# Patient Record
Sex: Female | Born: 2000 | Race: Black or African American | Hispanic: No | Marital: Single | Smoking: Never smoker
Health system: Southern US, Community
[De-identification: ages and names within clinical notes are randomized; demographics above are authoritative.]

## PROBLEM LIST (undated history)

## (undated) DIAGNOSIS — R519 Headache, unspecified: Secondary | ICD-10-CM

## (undated) DIAGNOSIS — Z789 Other specified health status: Secondary | ICD-10-CM

## (undated) HISTORY — PX: NO PAST SURGERIES: SHX2092

## (undated) HISTORY — DX: Other specified health status: Z78.9

---
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
## 2019-03-25 ENCOUNTER — Emergency Department (INDEPENDENT_AMBULATORY_CARE_PROVIDER_SITE_OTHER)
Admission: EM | Admit: 2019-03-25 | Discharge: 2019-03-25 | Disposition: A | Discharge status: Home / Self Care | Payer: Self-pay | Source: Home / Self Care

## 2019-03-25 ENCOUNTER — Encounter: Payer: Self-pay | Admitting: Family Medicine

## 2019-03-25 ENCOUNTER — Other Ambulatory Visit: Payer: Self-pay

## 2019-03-25 DIAGNOSIS — Z20822 Contact with and (suspected) exposure to covid-19: Secondary | ICD-10-CM

## 2019-03-25 NOTE — ED Triage Notes (Signed)
Pt c/o HA, nasal congestion and fatigue x 1 day. Dad exposed to COVID.
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
## 2019-03-25 NOTE — Discharge Instructions (Addendum)
Vitamin D3 5000 IU (125 mg) daily °Vitamin C 500 mg twice daily °Zinc 50 to 75 mg daily ° °Listerine type mouthwash 4 times a day ° °

## 2019-03-27 LAB — NOVEL CORONAVIRUS, NAA: SARS-CoV-2, NAA: NOT DETECTED

## 2021-02-22 ENCOUNTER — Other Ambulatory Visit: Payer: Self-pay

## 2021-02-22 ENCOUNTER — Ambulatory Visit
Admission: EM | Admit: 2021-02-22 | Discharge: 2021-02-22 | Disposition: A | Discharge status: Home / Self Care | Payer: Managed Care, Other (non HMO) | Attending: Emergency Medicine | Admitting: Emergency Medicine

## 2021-02-22 DIAGNOSIS — M545 Low back pain, unspecified: Secondary | ICD-10-CM

## 2021-02-22 LAB — POCT URINE PREGNANCY: Preg Test, Ur: NEGATIVE

## 2021-02-22 MED ORDER — BACLOFEN 10 MG PO TABS: 10.0000 mg | ORAL_TABLET | Freq: Three times a day (TID) | ORAL | 0 refills | Status: AC

## 2021-02-22 MED ORDER — IBUPROFEN 800 MG PO TABS: 800.0000 mg | ORAL_TABLET | Freq: Three times a day (TID) | ORAL | 0 refills | Status: DC

## 2021-02-22 MED ORDER — KETOROLAC TROMETHAMINE 60 MG/2ML IM SOLN
60.0000 mg | Freq: Once | INTRAMUSCULAR | Status: AC
  Administered 2021-02-22: 60 mg via INTRAMUSCULAR

## 2021-02-22 NOTE — ED Triage Notes (Signed)
Pt presents with c/o neck and back pain. Pt states she wants a pregnancy test.

## 2021-02-22 NOTE — Discharge Instructions (Addendum)
You received an injection of ketorolac today, high-dose nonsteroidal anti-inflammatory pain medication that should significantly reduce your pain for the next 6 to 8 hours.  Please begin taking baclofen and ibuprofen tonight, prescriptions have been sent to your pharmacy.  I have provided you with a note to be out of work for the next few days, please take advantage of this time to drink plenty of fluid, get rest and perform gentle stretching as tolerated without pain.
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
## 2021-03-26 ENCOUNTER — Ambulatory Visit
Admission: EM | Admit: 2021-03-26 | Discharge: 2021-03-26 | Disposition: A | Discharge status: Other Acute Inpatient Hospital | Payer: Self-pay

## 2021-07-24 ENCOUNTER — Ambulatory Visit
Admission: EM | Admit: 2021-07-24 | Discharge: 2021-07-24 | Disposition: A | Discharge status: Home / Self Care | Payer: BC Managed Care – PPO | Attending: Internal Medicine | Admitting: Internal Medicine

## 2021-07-24 ENCOUNTER — Encounter: Payer: Self-pay | Admitting: Emergency Medicine

## 2021-07-24 DIAGNOSIS — A084 Viral intestinal infection, unspecified: Secondary | ICD-10-CM | POA: Diagnosis not present

## 2021-07-24 DIAGNOSIS — R197 Diarrhea, unspecified: Secondary | ICD-10-CM | POA: Diagnosis not present

## 2021-07-24 DIAGNOSIS — R112 Nausea with vomiting, unspecified: Secondary | ICD-10-CM

## 2021-07-24 LAB — POCT URINE PREGNANCY: Preg Test, Ur: NEGATIVE

## 2021-07-24 MED ORDER — ONDANSETRON 4 MG PO TBDP: 4.0000 mg | ORAL_TABLET | Freq: Three times a day (TID) | ORAL | 0 refills | Status: DC | PRN

## 2021-07-24 NOTE — ED Triage Notes (Signed)
Patient c/o vomiting, diarrhea and generalized abdominal pain last night after eating at [employer].  Patient states she's had migraines all week. ?

## 2021-07-24 NOTE — ED Provider Notes (Addendum)
?[facility] ? ? ? ?CSN: [id number] ?Arrival date & time: 07/24/21  1311 ? ? ?  ? ?History   ?Chief Complaint ?Chief Complaint  ?Patient presents with  ? Emesis  ? ? ?HPI ?Natalie Ortega is a [age] y.o. female.  ? ?Patient presents with nausea, vomiting, diarrhea, generalized stomach ache, headache that started last night.  Patient reports associated upper respiratory symptoms as well but states that they been present "since the season changed" due to allergies.  Patient is been having intermittent headaches all week as well.  Denies any associated fever.  Stomachache is generalized.  Denies blood in stool or emesis.  Patient reports gastrointestinal symptoms started after eating at [employer] last night but she denies that it is attributed to this as her friends and family ate the same food and do not have any symptoms.  Patient has not been able to keep any food or fluids down today.  Patient is currently on her menstrual cycle but reports that her menstrual cycles have been irregular over the past few months.  She may skip a menstrual cycle which is typically not normal for her.  She does not take birth control but has had unprotected sexual intercourse. ? ? ?Emesis ? ?History reviewed. No pertinent past medical history. ? ?Patient Active Problem List  ? Diagnosis Date Noted  ? Left knee injury 07/25/2016  ? ? ?History reviewed. No pertinent surgical history. ? ?OB History   ?No obstetric history on file. ?  ? ? ? ?Home Medications   ? ?Prior to Admission medications   ?Medication Sig Start Date End Date Taking? Authorizing Provider  ?ondansetron (ZOFRAN-ODT) 4 MG disintegrating tablet Take 1 tablet (4 mg total) by mouth every 8 (eight) hours as needed for nausea or vomiting. 07/24/21  Yes Lesha Jager, Rolly SalterHaley E, FNP  ?ibuprofen (ADVIL) 800 MG tablet Take 1 tablet (800 mg total) by mouth 3 (three) times daily. 02/22/21   Theadora RamaMorgan, Lindsay Scales, PA-C  ? ? ?Family History ?Family History  ?Problem Relation Age of Onset   ? Asthma Mother   ? ? ?Social History ?Social History  ? ?Tobacco Use  ? Smoking status: Never  ? Smokeless tobacco: Never  ?Vaping Use  ? Vaping Use: Every day  ? Substances: THC  ?Substance Use Topics  ? Alcohol use: No  ? Drug use: Yes  ?  Types: Marijuana  ? ? ? ?Allergies   ?Patient has no known allergies. ? ? ?Review of Systems ?Review of Systems ?Per HPI ? ?Physical Exam ?Triage Vital Signs ?ED Triage Vitals  ?Enc Vitals Group  ?   BP 07/24/21 1406 127/86  ?   Pulse Rate 07/24/21 1406 86  ?   Resp 07/24/21 1406 18  ?   Temp 07/24/21 1406 (!) 97.4 ?F (36.3 ?C)  ?   Temp Source 07/24/21 1406 Oral  ?   SpO2 07/24/21 1406 98 %  ?   Weight 07/24/21 1407 180 lb (81.6 kg)  ?   Height 07/24/21 1407 5\' 5"  (1.651 m)  ?   Head Circumference --   ?   Peak Flow --   ?   Pain Score 07/24/21 1407 9  ?   Pain Loc --   ?   Pain Edu? --   ?   Excl. in GC? --   ? ?No data found. ? ?Updated Vital Signs ?BP 127/86 (BP Location: Left Arm)   Pulse 86   Temp (!) 97.4 ?F (36.3 ?C) (Oral)  Resp 18   Ht 5\' 5"  (1.651 m)   Wt 180 lb (81.6 kg)   LMP 07/23/2021   SpO2 98%   BMI 29.95 kg/m?  ? ?Visual Acuity ?Right Eye Distance:   ?Left Eye Distance:   ?Bilateral Distance:   ? ?Right Eye Near:   ?Left Eye Near:    ?Bilateral Near:    ? ?Physical Exam ?Constitutional:   ?   General: She is not in acute distress. ?   Appearance: Normal appearance. She is not toxic-appearing or diaphoretic.  ?HENT:  ?   Head: Normocephalic and atraumatic.  ?   Mouth/Throat:  ?   Mouth: Mucous membranes are moist.  ?   Pharynx: No posterior oropharyngeal erythema.  ?Eyes:  ?   Extraocular Movements: Extraocular movements intact.  ?   Conjunctiva/sclera: Conjunctivae normal.  ?Cardiovascular:  ?   Rate and Rhythm: Normal rate and regular rhythm.  ?   Pulses: Normal pulses.  ?   Heart sounds: Normal heart sounds.  ?Pulmonary:  ?   Effort: Pulmonary effort is normal. No respiratory distress.  ?   Breath sounds: Normal breath sounds.  ?Abdominal:  ?    General: Abdomen is flat. Bowel sounds are normal. There is no distension.  ?   Palpations: Abdomen is soft.  ?   Tenderness: There is no abdominal tenderness.  ?Skin: ?   General: Skin is warm and dry.  ?Neurological:  ?   General: No focal deficit present.  ?   Mental Status: She is alert and oriented to person, place, and time. Mental status is at baseline.  ?   Cranial Nerves: Cranial nerves 2-12 are intact.  ?   Sensory: Sensation is intact.  ?   Motor: Motor function is intact.  ?   Coordination: Coordination is intact.  ?   Gait: Gait is intact.  ?Psychiatric:     ?   Mood and Affect: Mood normal.     ?   Behavior: Behavior normal.     ?   Thought Content: Thought content normal.     ?   Judgment: Judgment normal.  ? ? ? ?UC Treatments / Results  ?Labs ?(all labs ordered are listed, but only abnormal results are displayed) ?Labs Reviewed  ?POCT URINE PREGNANCY  ? ? ?EKG ? ? ?Radiology ?No results found. ? ?Procedures ?Procedures (including critical care time) ? ?Medications Ordered in UC ?Medications - No data to display ? ?Initial Impression / Assessment and Plan / UC Course  ?I have reviewed the triage vital signs and the nursing notes. ? ?Pertinent labs & imaging results that were available during my care of the patient were reviewed by me and considered in my medical decision making (see chart for details). ? ?  ? ?Patient's symptoms appear viral in etiology.  Suspect headaches are related to a virus.  No concern for dehydration at this time.  Patient advised to ensure adequate fluid hydration.  Ondansetron prescribed to take as needed for nausea.  Urine pregnancy was negative.  Patient advised to follow-up with gynecology for further evaluation and management of irregular menstrual cycles.  Neuro exam is normal so do not think that head imaging is necessary.  Discussed strict return and ER precautions.  Patient verbalized understanding and was agreeable with plan. ?Final Clinical Impressions(s) / UC  Diagnoses  ? ?Final diagnoses:  ?Viral gastroenteritis  ?Nausea vomiting and diarrhea  ? ? ? ?Discharge Instructions   ? ?  ?It appears that  you may have a viral stomach virus that should run its course and self resolve in the next few days.  You have been prescribed a nausea medication to take as needed.  Please ensure adequate fluid hydration.  Go to the hospital if symptoms persist or worsen. ? ? ? ?ED Prescriptions   ? ? Medication Sig Dispense Auth. Provider  ? ondansetron (ZOFRAN-ODT) 4 MG disintegrating tablet Take 1 tablet (4 mg total) by mouth every 8 (eight) hours as needed for nausea or vomiting. 20 tablet Ervin Knack E, [place]  ? ?  ? ?PDMP not reviewed this encounter. ?  ?Gustavus Bryant, [place] ?07/24/21 1436 ? ?  ?Gustavus Bryant, [place] ?07/24/21 1436 ? ?

## 2021-07-24 NOTE — Discharge Instructions (Signed)
It appears that you may have a viral stomach virus that should run its course and self resolve in the next few days.  You have been prescribed a nausea medication to take as needed.  Please ensure adequate fluid hydration.  Go to the hospital if symptoms persist or worsen. ?

## 2021-11-21 ENCOUNTER — Encounter (HOSPITAL_COMMUNITY): Payer: Self-pay | Admitting: Obstetrics & Gynecology

## 2021-11-21 ENCOUNTER — Inpatient Hospital Stay (HOSPITAL_COMMUNITY): Payer: BC Managed Care – PPO

## 2021-11-21 ENCOUNTER — Other Ambulatory Visit: Payer: Self-pay

## 2021-11-21 ENCOUNTER — Inpatient Hospital Stay (HOSPITAL_COMMUNITY)
Admission: EM | Admit: 2021-11-21 | Discharge: 2021-11-21 | Disposition: A | Discharge status: Home / Self Care | Payer: BC Managed Care – PPO | Attending: Obstetrics & Gynecology | Admitting: Obstetrics & Gynecology

## 2021-11-21 DIAGNOSIS — R1030 Lower abdominal pain, unspecified: Secondary | ICD-10-CM | POA: Insufficient documentation

## 2021-11-21 DIAGNOSIS — O26891 Other specified pregnancy related conditions, first trimester: Secondary | ICD-10-CM | POA: Diagnosis not present

## 2021-11-21 DIAGNOSIS — O26893 Other specified pregnancy related conditions, third trimester: Secondary | ICD-10-CM

## 2021-11-21 DIAGNOSIS — R110 Nausea: Secondary | ICD-10-CM | POA: Insufficient documentation

## 2021-11-21 DIAGNOSIS — Z3A01 Less than 8 weeks gestation of pregnancy: Secondary | ICD-10-CM | POA: Insufficient documentation

## 2021-11-21 LAB — CBC WITH DIFFERENTIAL/PLATELET
Abs Immature Granulocytes: 0.01 10*3/uL (ref 0.00–0.07)
Basophils Absolute: 0 10*3/uL (ref 0.0–0.1)
Basophils Relative: 1 %
Eosinophils Absolute: 0 10*3/uL (ref 0.0–0.5)
Eosinophils Relative: 1 %
HCT: 36.6 % (ref 36.0–46.0)
Hemoglobin: 13.6 g/dL (ref 12.0–15.0)
Immature Granulocytes: 0 %
Lymphocytes Relative: 48 %
Lymphs Abs: 2.7 10*3/uL (ref 0.7–4.0)
MCH: 31.3 pg (ref 26.0–34.0)
MCHC: 37.2 g/dL — ABNORMAL HIGH (ref 30.0–36.0)
MCV: 84.1 fL (ref 80.0–100.0)
Monocytes Absolute: 0.5 10*3/uL (ref 0.1–1.0)
Monocytes Relative: 9 %
Neutro Abs: 2.3 10*3/uL (ref 1.7–7.7)
Neutrophils Relative %: 41 %
Platelets: 149 10*3/uL — ABNORMAL LOW (ref 150–400)
RBC: 4.35 MIL/uL (ref 3.87–5.11)
RDW: 10.9 % — ABNORMAL LOW (ref 11.5–15.5)
WBC: 5.5 10*3/uL (ref 4.0–10.5)
nRBC: 0 % (ref 0.0–0.2)

## 2021-11-21 LAB — URINALYSIS, ROUTINE W REFLEX MICROSCOPIC
Bilirubin Urine: NEGATIVE
Glucose, UA: NEGATIVE mg/dL
Hgb urine dipstick: NEGATIVE
Ketones, ur: NEGATIVE mg/dL
Leukocytes,Ua: NEGATIVE
Nitrite: NEGATIVE
Protein, ur: NEGATIVE mg/dL
Specific Gravity, Urine: 1.014 (ref 1.005–1.030)
pH: 8 (ref 5.0–8.0)

## 2021-11-21 LAB — COMPREHENSIVE METABOLIC PANEL
ALT: 11 U/L (ref 0–44)
AST: 14 U/L — ABNORMAL LOW (ref 15–41)
Albumin: 4.3 g/dL (ref 3.5–5.0)
Alkaline Phosphatase: 40 U/L (ref 38–126)
Anion gap: 10 (ref 5–15)
BUN: 6 mg/dL (ref 6–20)
CO2: 21 mmol/L — ABNORMAL LOW (ref 22–32)
Calcium: 9.4 mg/dL (ref 8.9–10.3)
Chloride: 107 mmol/L (ref 98–111)
Creatinine, Ser: 0.71 mg/dL (ref 0.44–1.00)
GFR, Estimated: >60 mL/min (ref >60)
Glucose, Bld: 89 mg/dL (ref 70–99)
Potassium: 3.5 mmol/L (ref 3.5–5.1)
Sodium: 138 mmol/L (ref 135–145)
Total Bilirubin: 0.9 mg/dL (ref 0.3–1.2)
Total Protein: 7.2 g/dL (ref 6.5–8.1)

## 2021-11-21 LAB — HIV ANTIBODY (ROUTINE TESTING W REFLEX): HIV Screen 4th Generation wRfx: NONREACTIVE

## 2021-11-21 LAB — WET PREP, GENITAL
Clue Cells Wet Prep HPF POC: NONE SEEN
Sperm: NONE SEEN
Trich, Wet Prep: NONE SEEN
WBC, Wet Prep HPF POC: <10 (ref <10)
Yeast Wet Prep HPF POC: NONE SEEN

## 2021-11-21 LAB — HCG, QUANTITATIVE, PREGNANCY: hCG, Beta Chain, Quant, S: 14168 m[IU]/mL — ABNORMAL HIGH (ref <5)

## 2021-11-21 LAB — ABO/RH: ABO/RH(D): O POS

## 2021-11-21 LAB — POCT PREGNANCY, URINE: Preg Test, Ur: POSITIVE — AB

## 2021-11-21 MED ORDER — ONDANSETRON 4 MG PO TBDP: 4.0000 mg | ORAL_TABLET | Freq: Three times a day (TID) | ORAL | 0 refills | Status: DC | PRN

## 2021-11-21 NOTE — MAU Provider Note (Signed)
History     CSN: [id number]  Arrival date and time: 11/21/21 1148   None     Chief Complaint  Patient presents with   Abdominal Pain   HPI  Natalie Ortega is a 21 y.o. female G1P0 @ [redacted]w[redacted]d here in MAU with lower abdominal pain. The pain has been present for a few weeks. She has no bleeding. She is certain of her LMP. The pain comes and goes. The pain worsens when she is sitting and relaxing. She has not taken anything for the pain.   She has an appointment scheduled with [facility] this month.  Occasional Nausea.    OB History     Gravida  1   Para      Term      Preterm      AB      Living         SAB      IAB      Ectopic      Multiple      Live Births              History reviewed. No pertinent past medical history.  History reviewed. No pertinent surgical history.  Family History  Problem Relation Age of Onset   Asthma Mother     Social History   Tobacco Use   Smoking status: Never   Smokeless tobacco: Never  Vaping Use   Vaping Use: Every day   Substances: THC  Substance Use Topics   Alcohol use: No    Comment: 1 glass of wine within the last  wks   Drug use: Not Currently    Types: Marijuana    Allergies: No Known Allergies  Medications Prior to Admission  Medication Sig Dispense Refill Last Dose   ibuprofen (ADVIL) 800 MG tablet Take 1 tablet (800 mg total) by mouth 3 (three) times daily. 21 tablet 0    ondansetron (ZOFRAN-ODT) 4 MG disintegrating tablet Take 1 tablet (4 mg total) by mouth every 8 (eight) hours as needed for nausea or vomiting. 20 tablet 0    Results for orders placed or performed during the hospital encounter of 11/21/21 (from the past 48 hour(s))  Pregnancy, urine POC     Status: Abnormal   Collection Time: 11/21/21 12:16 PM  Result Value Ref Range   Preg Test, Ur POSITIVE (A) NEGATIVE    Comment:        THE SENSITIVITY OF THIS METHODOLOGY IS >24 mIU/mL   CBC with Differential/Platelet     Status: Abnormal    Collection Time: 11/21/21 12:55 PM  Result Value Ref Range   WBC 5.5 4.0 - 10.5 K/uL    Comment: WHITE COUNT CONFIRMED ON SMEAR   RBC 4.35 3.87 - 5.11 MIL/uL   Hemoglobin 13.6 12.0 - 15.0 g/dL   HCT 48.1 85.6 - 31.4 %   MCV 84.1 80.0 - 100.0 fL   MCH 31.3 26.0 - 34.0 pg   MCHC 37.2 (H) 30.0 - 36.0 g/dL   RDW 97.0 (L) 26.3 - 78.5 %   Platelets 149 (L) 150 - 400 K/uL   nRBC 0.0 0.0 - 0.2 %   Neutrophils Relative % 41 %   Neutro Abs 2.3 1.7 - 7.7 K/uL   Lymphocytes Relative 48 %   Lymphs Abs 2.7 0.7 - 4.0 K/uL   Monocytes Relative 9 %   Monocytes Absolute 0.5 0.1 - 1.0 K/uL   Eosinophils Relative 1 %   Eosinophils Absolute 0.0 0.0 -  0.5 K/uL   Basophils Relative 1 %   Basophils Absolute 0.0 0.0 - 0.1 K/uL   WBC Morphology MORPHOLOGY UNREMARKABLE    RBC Morphology MORPHOLOGY UNREMARKABLE    Smear Review MORPHOLOGY UNREMARKABLE    Immature Granulocytes 0 %   Abs Immature Granulocytes 0.01 0.00 - 0.07 K/uL    Comment: Performed at [facility], [address] [place], [place] [address]  Comprehensive metabolic panel     Status: Abnormal   Collection Time: 11/21/21 12:55 PM  Result Value Ref Range   Sodium 138 135 - 145 mmol/L   Potassium 3.5 3.5 - 5.1 mmol/L   Chloride 107 98 - 111 mmol/L   CO2 21 (L) 22 - 32 mmol/L   Glucose, Bld 89 70 - 99 mg/dL    Comment: Glucose reference range applies only to samples taken after fasting for at least 8 hours.   BUN 6 6 - 20 mg/dL   Creatinine, Ser 8.41 0.44 - 1.00 mg/dL   Calcium 9.4 8.9 - 32.4 mg/dL   Total Protein 7.2 6.5 - 8.1 g/dL   Albumin 4.3 3.5 - 5.0 g/dL   AST 14 (L) 15 - 41 U/L   ALT 11 0 - 44 U/L   Alkaline Phosphatase 40 38 - 126 U/L   Total Bilirubin 0.9 0.3 - 1.2 mg/dL   GFR, Estimated >40 >10 mL/min    Comment: (NOTE) Calculated using the CKD-EPI Creatinine Equation (2021)    Anion gap 10 5 - 15    Comment: Performed at [facility], [address] [place], [place] [address]  ABO/Rh     Status: None    Collection Time: 11/21/21 12:55 PM  Result Value Ref Range   ABO/RH(D) O POS    No rh immune globuloin      NOT A RH IMMUNE GLOBULIN CANDIDATE, PT RH POSITIVE Performed at [facility], [address] [place], [place] [address]   Urinalysis, Routine w reflex microscopic Urine, Clean Catch     Status: None   Collection Time: 11/21/21  1:15 PM  Result Value Ref Range   Color, Urine YELLOW YELLOW   APPearance CLEAR CLEAR   Specific Gravity, Urine 1.014 1.005 - 1.030   pH 8.0 5.0 - 8.0   Glucose, UA NEGATIVE NEGATIVE mg/dL   Hgb urine dipstick NEGATIVE NEGATIVE   Bilirubin Urine NEGATIVE NEGATIVE   Ketones, ur NEGATIVE NEGATIVE mg/dL   Protein, ur NEGATIVE NEGATIVE mg/dL   Nitrite NEGATIVE NEGATIVE   Leukocytes,Ua NEGATIVE NEGATIVE    Comment: Performed at [facility], [address], [place], [place] [address]  Wet prep, genital     Status: None   Collection Time: 11/21/21  1:41 PM   Specimen: PATH Cytology Cervicovaginal Ancillary Only  Result Value Ref Range   Yeast Wet Prep HPF POC NONE SEEN NONE SEEN   Trich, Wet Prep NONE SEEN NONE SEEN   Clue Cells Wet Prep HPF POC NONE SEEN NONE SEEN   WBC, Wet Prep HPF POC <10 <10   Sperm NONE SEEN     Comment: Performed at [facility], [address] [place], [place] [address]    US OB LESS THAN 14 WEEKS WITH [place] TRANSVAGINAL  Result Date: 11/21/2021 CLINICAL DATA:  Cramping. EXAM: OBSTETRIC <14 WK [place] AND TRANSVAGINAL OB US TECHNIQUE: Both transabdominal and transvaginal ultrasound examinations were performed for complete evaluation of the gestation as well as the maternal uterus, adnexal regions,  and pelvic cul-de-sac. Transvaginal technique was performed to assess early pregnancy. COMPARISON:  None Available. FINDINGS: Intrauterine gestational sac: Single Yolk sac:  Visualized. Embryo:  Not Visualized. Cardiac Activity: Not Visualized. MSD: 11.8 mm   6 w   0 d Subchorionic hemorrhage:  None visualized. Maternal  uterus/adnexae: No free fluid is noted. Probable corpus luteum seen in left ovary. Right ovary is unremarkable. IMPRESSION: Probable early intrauterine gestational sac with yolk sac, but no fetal pole or cardiac activity yet visualized. Recommend follow-up quantitative B-HCG levels and follow-up US in 14 days to assess viability. This recommendation follows SRU consensus guidelines: Diagnostic Criteria for Nonviable Pregnancy Early in the First Trimester. Malva Limes Med 2013; 630:1601-09. Electronically Signed   By: Lupita Raider M.D.   On: 11/21/2021 13:38     Review of Systems  Gastrointestinal:  Positive for abdominal pain, nausea and vomiting.  Genitourinary:  Negative for vaginal bleeding.   Physical Exam   Blood pressure 111/78, pulse 73, temperature 98 F (36.7 C), temperature source Oral, resp. rate 15, weight 84.8 kg, last menstrual period 10/15/2021, SpO2 99 %.  Physical Exam Constitutional:      General: She is not in acute distress.    Appearance: She is well-developed. She is not ill-appearing, toxic-appearing or diaphoretic.  Abdominal:     Tenderness: There is generalized abdominal tenderness. There is no guarding or rebound.  Skin:    General: Skin is warm.  Neurological:     Mental Status: She is alert and oriented to person, place, and time.    Procedures  MDM  Wet prep & GC HIV, CBC, Hcg, ABO US OB transvaginal   Reviewed [place] results with patient and partner.   Assessment and Plan   A:  1. Abdominal pain during pregnancy in third trimester   2. [redacted] weeks gestation of pregnancy      P:  Dc home Return to MAU if symptoms worsen Keep OB appointment Start prenatal vitamins RX: Zofran as needed  Venia Carbon I, NP. 11/21/2021 2:21 PM

## 2021-11-21 NOTE — MAU Note (Signed)
Pt reports to mau with c/o lower abd cramping and stabbing pain that has been ongoing for the past 3 weeks.  Pt denies bleeding or dc

## 2021-11-23 LAB — GC/CHLAMYDIA PROBE AMP (~~LOC~~) NOT AT ARMC
Chlamydia: NEGATIVE
Comment: NEGATIVE
Comment: NORMAL
Neisseria Gonorrhea: NEGATIVE

## 2021-12-31 ENCOUNTER — Other Ambulatory Visit: Payer: Self-pay

## 2021-12-31 ENCOUNTER — Inpatient Hospital Stay (HOSPITAL_COMMUNITY)
Admission: AD | Admit: 2021-12-31 | Discharge: 2021-12-31 | Disposition: A | Discharge status: Home / Self Care | Payer: BC Managed Care – PPO | Attending: Obstetrics and Gynecology | Admitting: Obstetrics and Gynecology

## 2021-12-31 ENCOUNTER — Encounter (HOSPITAL_COMMUNITY): Payer: Self-pay | Admitting: Obstetrics and Gynecology

## 2021-12-31 DIAGNOSIS — R0602 Shortness of breath: Secondary | ICD-10-CM | POA: Insufficient documentation

## 2021-12-31 DIAGNOSIS — R200 Anesthesia of skin: Secondary | ICD-10-CM | POA: Insufficient documentation

## 2021-12-31 DIAGNOSIS — O26891 Other specified pregnancy related conditions, first trimester: Secondary | ICD-10-CM | POA: Diagnosis not present

## 2021-12-31 DIAGNOSIS — Z3A11 11 weeks gestation of pregnancy: Secondary | ICD-10-CM | POA: Insufficient documentation

## 2021-12-31 DIAGNOSIS — R202 Paresthesia of skin: Secondary | ICD-10-CM | POA: Insufficient documentation

## 2021-12-31 LAB — GLUCOSE, CAPILLARY: Glucose-Capillary: 92 mg/dL (ref 70–99)

## 2021-12-31 NOTE — ED Notes (Signed)
Cant find pt in lobby

## 2021-12-31 NOTE — MAU Note (Signed)
Natalie Ortega is a 21 y.o. at [redacted]w[redacted]d here in MAU reporting: last night started experiencing some arm numbness. It started at her wrist and shot up to the shoulder. Now is feeling numb from elbow to shoulder. This is affected her right arm only. Numbness is constant. No abdominal pain or bleeding.   Onset of complaint: last night  Pain score: 8/10  Vitals:   12/31/21 1222  BP: 128/76  Pulse: 83  Resp: 16  Temp: 98.1 F (36.7 C)  SpO2: 100%     FHT:178  Lab orders placed from triage: none

## 2021-12-31 NOTE — MAU Provider Note (Signed)
History     [id number]  Arrival date and time: 12/31/21 1202    Chief Complaint  Patient presents with   Numbness     HPI Natalie Ortega is a 21 y.o. at [redacted]w[redacted]d who presents for arm numbness & shortness of breath. Symptoms started last night. Reports bilateral arm numbness & tingling - mostly constant with occasional reprieve that only lasts a minute then returns if she moves. Also reports some intermittent numbness & tingling of her left leg. Since symptoms started she has also noticed shortness of breath. SOB is constant even while at rest but is worse when she's walking. Has history of migraines & reports headache a few days ago but currently denies headache. Reports some neck pain while sleeping last night. Denies fever, chest pain, cough, injury/accident/fall. Denies abdominal pain or vaginal bleeding. Has f/u with [facility] next week.   OB History     Gravida  1   Para      Term      Preterm      AB      Living         SAB      IAB      Ectopic      Multiple      Live Births              Past Medical History:  Diagnosis Date   No pertinent past medical history     Past Surgical History:  Procedure Laterality Date   NO PAST SURGERIES      Family History  Problem Relation Age of Onset   Asthma Mother     No Known Allergies  No current facility-administered medications on file prior to encounter.   Current Outpatient Medications on File Prior to Encounter  Medication Sig Dispense Refill   Prenatal Vit-Fe Fumarate-FA (M-NATAL PLUS) 27-1 MG TABS Take 1 tablet by mouth daily.     ondansetron (ZOFRAN-ODT) 4 MG disintegrating tablet Take 1 tablet (4 mg total) by mouth every 8 (eight) hours as needed for nausea or vomiting. 20 tablet 0     ROS Pertinent positives and negative per HPI, all others reviewed and negative  Physical Exam   BP 120/71   Pulse 85   Temp 99.3 F (37.4 C)   Resp 16   Ht 5\' 5"  (1.651 m)   Wt 82.5 kg   LMP 10/15/2021    SpO2 100%   BMI 30.25 kg/m   Patient Vitals for the past 24 hrs:  BP Temp Temp src Pulse Resp SpO2 Height Weight  12/31/21 1317 120/71 99.3 F (37.4 C) -- 85 16 100 % -- --  12/31/21 1222 128/76 98.1 F (36.7 C) Oral 83 16 100 % -- --  12/31/21 1215 -- -- -- -- -- -- 5\' 5"  (1.651 m) 82.5 kg    Physical Exam Vitals and nursing note reviewed.  Constitutional:      General: She is not in acute distress.    Appearance: Normal appearance. She is not diaphoretic.  HENT:     Head: Normocephalic and atraumatic.  Eyes:     General: No scleral icterus.    Conjunctiva/sclera: Conjunctivae normal.  Pulmonary:     Effort: Pulmonary effort is normal. No respiratory distress.  Neurological:     General: No focal deficit present.     Mental Status: She is alert.  Psychiatric:        Mood and Affect: Mood normal.  Behavior: Behavior normal.       Labs Results for orders placed or performed during the hospital encounter of 12/31/21 (from the past 24 hour(s))  Glucose, capillary     Status: None   Collection Time: 12/31/21  1:18 PM  Result Value Ref Range   Glucose-Capillary 92 70 - 99 mg/dL    Imaging No results found.  MAU Course  Procedures Lab Orders         Glucose, capillary    No orders of the defined types were placed in this encounter.  Imaging Orders  No imaging studies ordered today    MDM FHT present via doppler No OB complaints Vital signs stable  Assessment and Plan   1. Bilateral numbness and tingling of arms and legs   2. Shortness of breath   3. [redacted] weeks gestation of pregnancy    -Discussed patient with Delmer Islam PA in ED. Will transfer patient to [facility] for further evaluation of symptoms  Jorje Guild, NP 12/31/21 1:47 PM

## 2021-12-31 NOTE — MAU Note (Signed)
Report given to [facility] in [place]. Will transport pt via wheelchair.

## 2022-01-03 LAB — OB RESULTS CONSOLE RUBELLA ANTIBODY, IGM: Rubella: NON-IMMUNE/NOT IMMUNE

## 2022-01-03 LAB — OB RESULTS CONSOLE HEPATITIS B SURFACE ANTIGEN: Hepatitis B Surface Ag: NEGATIVE

## 2022-01-03 LAB — HEPATITIS C ANTIBODY: HCV Ab: NEGATIVE

## 2022-01-03 LAB — OB RESULTS CONSOLE ANTIBODY SCREEN: Antibody Screen: NEGATIVE

## 2022-03-18 ENCOUNTER — Inpatient Hospital Stay (HOSPITAL_COMMUNITY)
Admission: AD | Admit: 2022-03-18 | Discharge: 2022-03-18 | Disposition: A | Discharge status: Home / Self Care | Payer: BC Managed Care – PPO | Attending: Obstetrics and Gynecology | Admitting: Obstetrics and Gynecology

## 2022-03-18 ENCOUNTER — Encounter (HOSPITAL_COMMUNITY): Payer: Self-pay | Admitting: Obstetrics and Gynecology

## 2022-03-18 DIAGNOSIS — O26892 Other specified pregnancy related conditions, second trimester: Secondary | ICD-10-CM | POA: Insufficient documentation

## 2022-03-18 DIAGNOSIS — R519 Headache, unspecified: Secondary | ICD-10-CM | POA: Diagnosis not present

## 2022-03-18 DIAGNOSIS — Z3A22 22 weeks gestation of pregnancy: Secondary | ICD-10-CM | POA: Insufficient documentation

## 2022-03-18 DIAGNOSIS — O212 Late vomiting of pregnancy: Secondary | ICD-10-CM | POA: Insufficient documentation

## 2022-03-18 HISTORY — DX: Headache, unspecified: R51.9

## 2022-03-18 LAB — CBC
HCT: 30.8 % — ABNORMAL LOW (ref 36.0–46.0)
Hemoglobin: 10.8 g/dL — ABNORMAL LOW (ref 12.0–15.0)
MCH: 31.5 pg (ref 26.0–34.0)
MCHC: 35.1 g/dL (ref 30.0–36.0)
MCV: 89.8 fL (ref 80.0–100.0)
Platelets: 123 10*3/uL — ABNORMAL LOW (ref 150–400)
RBC: 3.43 MIL/uL — ABNORMAL LOW (ref 3.87–5.11)
RDW: 12.3 % (ref 11.5–15.5)
WBC: 7.3 10*3/uL (ref 4.0–10.5)
nRBC: 0 % (ref 0.0–0.2)

## 2022-03-18 LAB — URINALYSIS, ROUTINE W REFLEX MICROSCOPIC
Bilirubin Urine: NEGATIVE
Glucose, UA: NEGATIVE mg/dL
Hgb urine dipstick: NEGATIVE
Ketones, ur: NEGATIVE mg/dL
Leukocytes,Ua: NEGATIVE
Nitrite: NEGATIVE
Protein, ur: NEGATIVE mg/dL
Specific Gravity, Urine: 1.006 (ref 1.005–1.030)
pH: 7 (ref 5.0–8.0)

## 2022-03-18 LAB — COMPREHENSIVE METABOLIC PANEL
ALT: 18 U/L (ref 0–44)
AST: 24 U/L (ref 15–41)
Albumin: 3.4 g/dL — ABNORMAL LOW (ref 3.5–5.0)
Alkaline Phosphatase: 42 U/L (ref 38–126)
Anion gap: 11 (ref 5–15)
BUN: <5 mg/dL — ABNORMAL LOW (ref 6–20)
CO2: 19 mmol/L — ABNORMAL LOW (ref 22–32)
Calcium: 9.1 mg/dL (ref 8.9–10.3)
Chloride: 107 mmol/L (ref 98–111)
Creatinine, Ser: 0.56 mg/dL (ref 0.44–1.00)
GFR, Estimated: >60 mL/min (ref >60)
Glucose, Bld: 74 mg/dL (ref 70–99)
Potassium: 3.7 mmol/L (ref 3.5–5.1)
Sodium: 137 mmol/L (ref 135–145)
Total Bilirubin: 0.3 mg/dL (ref 0.3–1.2)
Total Protein: 6.5 g/dL (ref 6.5–8.1)

## 2022-03-18 LAB — PROTEIN / CREATININE RATIO, URINE
Creatinine, Urine: 43 mg/dL
Total Protein, Urine: <6 mg/dL

## 2022-03-18 MED ORDER — EXCEDRIN TENSION HEADACHE 500-65 MG PO TABS: 2.0000 | ORAL_TABLET | Freq: Four times a day (QID) | ORAL | 0 refills | Status: AC | PRN

## 2022-03-18 MED ORDER — DIPHENHYDRAMINE HCL 50 MG/ML IJ SOLN
25.0000 mg | Freq: Once | INTRAMUSCULAR | Status: AC
  Administered 2022-03-18: 25 mg via INTRAVENOUS
  Filled 2022-03-18: qty 1

## 2022-03-18 MED ORDER — CYCLOBENZAPRINE HCL 10 MG PO TABS: 10.0000 mg | ORAL_TABLET | Freq: Two times a day (BID) | ORAL | 0 refills | Status: AC | PRN

## 2022-03-18 MED ORDER — METOCLOPRAMIDE HCL 5 MG/ML IJ SOLN
10.0000 mg | Freq: Once | INTRAMUSCULAR | Status: AC
  Administered 2022-03-18: 10 mg via INTRAVENOUS
  Filled 2022-03-18: qty 2

## 2022-03-18 MED ORDER — LACTATED RINGERS IV BOLUS
1000.0000 mL | Freq: Once | INTRAVENOUS | Status: AC
  Administered 2022-03-18: 1000 mL via INTRAVENOUS

## 2022-03-18 NOTE — MAU Note (Incomplete)
.  Natalie Ortega is a 21 y.o. at [redacted]w[redacted]d here in MAU reporting:  For the past 4 days she has been seeing black spots in her vision, feels dizzy and get as a bad headache. Arms become weak and numb and it feels like she is going to pass out.  Has taken fiorecet with some releif but not completely goes away.   Onset of complaint: 4 days Pain score: 7 Vitals:   03/18/22 1112  BP: 125/71  Pulse: 81  Resp: 18  Temp: 98.2 F (36.8 C)     FHT:144 Lab orders placed from triage:  u/a

## 2022-03-18 NOTE — MAU Provider Note (Signed)
History     CSN: [id number]  Arrival date and time: 03/18/22 1050   None     Chief Complaint  Patient presents with   Headache   Dizziness   HPI Natalie Ortega is a 21 y.o. G1P0 at [redacted]w[redacted]d who presents to MAU for headache. Patient reports a constant frontal/behind the eyes headache for the last 4 days. She reports numbness in her hands and then seeing spots in her vision which is promptly followed by a headache and nausea/vomiting. She has a history of migraines but has never had numbness or spots in vision with her migraines. Nothing makes the headache better. Bright lights aggravate symptoms. She has been taking Fioricet without any relief. She reports she did try Tylenol 2 nights ago which also did not help. She is currently rating headache 7/10. She has had approximately two 16oz bottles plus a large cup of water to drink in the last 24 hours. She reports she last ate yesterday- had [place], but has not had anything today because of the headache.   Patient receives [facility] at [facility] and next appointment is scheduled on 1/17.    OB History     Gravida  1   Para      Term      Preterm      AB      Living         SAB      IAB      Ectopic      Multiple      Live Births              Past Medical History:  Diagnosis Date   Headache    No pertinent past medical history     Past Surgical History:  Procedure Laterality Date   NO PAST SURGERIES      Family History  Problem Relation Age of Onset   Asthma Mother     Social History   Tobacco Use   Smoking status: Never   Smokeless tobacco: Never  Vaping Use   Vaping Use: Every day   Substances: THC  Substance Use Topics   Alcohol use: No    Comment: 1 glass of wine within the last  wks   Drug use: Not Currently    Types: Marijuana    Allergies: No Known Allergies  No medications prior to admission.   Review of Systems  Constitutional: Negative.   Eyes:  Positive for photophobia  and visual disturbance.  Respiratory: Negative.    Cardiovascular: Negative.   Gastrointestinal:  Positive for nausea and vomiting.  Genitourinary: Negative.   Neurological:  Positive for numbness (in hands) and headaches.   Physical Exam  Patient Vitals for the past 24 hrs:  BP Temp Pulse Resp SpO2 Height Weight  03/18/22 1513 119/69 -- 83 15 100 % -- --  03/18/22 1230 118/63 -- 82 -- 100 % -- --  03/18/22 1225 -- -- -- -- 100 % -- --  03/18/22 1220 -- -- -- -- 99 % -- --  03/18/22 1216 101/80 -- 79 -- -- -- --  03/18/22 1215 -- -- -- -- 100 % -- --  03/18/22 1210 -- -- -- -- 100 % -- --  03/18/22 1205 -- -- -- -- 99 % -- --  03/18/22 1201 116/66 -- 73 -- -- -- --  03/18/22 1200 -- -- -- -- 100 % -- --  03/18/22 1155 -- -- -- -- 100 % -- --  03/18/22 1150 -- -- -- -- 100 % -- --  03/18/22 1146 (!) 119/59 -- 68 -- -- -- --  03/18/22 1145 -- -- -- -- 99 % -- --  03/18/22 1140 -- -- -- -- 100 % -- --  03/18/22 1135 -- -- -- -- 100 % -- --  03/18/22 1131 123/77 -- 77 -- -- -- --  03/18/22 1112 125/71 98.2 F (36.8 C) 81 18 -- 5\' 5"  (1.651 m) 87.5 kg   Physical Exam Vitals and nursing note reviewed.  Constitutional:      General: She is not in acute distress. Eyes:     Extraocular Movements: Extraocular movements intact.     Pupils: Pupils are equal, round, and reactive to light.  Cardiovascular:     Rate and Rhythm: Normal rate.  Pulmonary:     Effort: Pulmonary effort is normal.  Abdominal:     Palpations: Abdomen is soft.     Tenderness: There is no abdominal tenderness.     Comments: Gravid    Musculoskeletal:        General: Normal range of motion.     Cervical back: Normal range of motion.  Skin:    General: Skin is warm and dry.  Neurological:     Mental Status: She is alert and oriented to person, place, and time.     GCS: GCS eye subscore is 4. GCS verbal subscore is 5. GCS motor subscore is 6.     Cranial Nerves: No cranial nerve deficit or facial  asymmetry.     Sensory: No sensory deficit.     Motor: No weakness.     Gait: Gait normal.     Deep Tendon Reflexes: Reflexes normal.  Psychiatric:        Mood and Affect: Mood normal.        Speech: Speech normal.        Behavior: Behavior normal.    FHR: 144 bpm via doppler  Results for orders placed or performed during the hospital encounter of 03/18/22 (from the past 24 hour(s))  Urinalysis, Routine w reflex microscopic Urine, Clean Catch     Status: None   Collection Time: 03/18/22 11:30 AM  Result Value Ref Range   Color, Urine YELLOW YELLOW   APPearance CLEAR CLEAR   Specific Gravity, Urine 1.006 1.005 - 1.030   pH 7.0 5.0 - 8.0   Glucose, UA NEGATIVE NEGATIVE mg/dL   Hgb urine dipstick NEGATIVE NEGATIVE   Bilirubin Urine NEGATIVE NEGATIVE   Ketones, ur NEGATIVE NEGATIVE mg/dL   Protein, ur NEGATIVE NEGATIVE mg/dL   Nitrite NEGATIVE NEGATIVE   Leukocytes,Ua NEGATIVE NEGATIVE  Protein / creatinine ratio, urine     Status: None   Collection Time: 03/18/22 11:56 AM  Result Value Ref Range   Creatinine, Urine 43 mg/dL   Total Protein, Urine <6 mg/dL   Protein Creatinine Ratio        0.00 - 0.15 mg/mg[Cre]  CBC     Status: Abnormal   Collection Time: 03/18/22 12:09 PM  Result Value Ref Range   WBC 7.3 4.0 - 10.5 K/uL   RBC 3.43 (L) 3.87 - 5.11 MIL/uL   Hemoglobin 10.8 (L) 12.0 - 15.0 g/dL   HCT 30.8 (L) 36.0 - 46.0 %   MCV 89.8 80.0 - 100.0 fL   MCH 31.5 26.0 - 34.0 pg   MCHC 35.1 30.0 - 36.0 g/dL   RDW 12.3 11.5 - 15.5 %   Platelets 123 (L) 150 -  400 K/uL   nRBC 0.0 0.0 - 0.2 %  Comprehensive metabolic panel     Status: Abnormal   Collection Time: 03/18/22 12:09 PM  Result Value Ref Range   Sodium 137 135 - 145 mmol/L   Potassium 3.7 3.5 - 5.1 mmol/L   Chloride 107 98 - 111 mmol/L   CO2 19 (L) 22 - 32 mmol/L   Glucose, Bld 74 70 - 99 mg/dL   BUN <5 (L) 6 - 20 mg/dL   Creatinine, Ser 4.12 0.44 - 1.00 mg/dL   Calcium 9.1 8.9 - 87.8 mg/dL   Total Protein 6.5  6.5 - 8.1 g/dL   Albumin 3.4 (L) 3.5 - 5.0 g/dL   AST 24 15 - 41 U/L   ALT 18 0 - 44 U/L   Alkaline Phosphatase 42 38 - 126 U/L   Total Bilirubin 0.3 0.3 - 1.2 mg/dL   GFR, Estimated >67 >67 mL/min   Anion gap 11 5 - 15    MAU Course  Procedures  MDM UA, CBC, CMP, UPCR LR bolus Headache cocktail with IV Reglan and Benadryl  On physical exam patient noted to have eyes closed throughout entirety of assessment. Neuro exam intact without any deficits. She does not have symptoms other than headache at this time. VSS and BP's normotensive. I have a low suspicion for PEC, however will order labs. IVF's and headache cocktail also ordered.   1335: In to reassess patient and discuss lab work. Labs reassuring, no significant electrolyte imbalance or leukocytosis. Patient is mildly anemic. Patient sleeping. Her mother is present at bedside. She has about 500cc of LR left in bag. Will let patient get the rest of IVF's and reassess headache at later time.  1430: Patient sitting up in bed. Reports headache has improved and currently rating 3/10. Instructed patient to stop taking Fioricet as it can cause rebound/worsening headaches. Will send rx for Excedrin Tension and Flexeril to pharmacy. Also encouraged patient to increase PO hydration as she only had 2 16oz bottles of water yesterday. I also reminded patient the importance of eating regularly as she has not had anything to eat today. Encouraged small, frequent meals and focus on protein.   Assessment and Plan  [redacted] weeks gestation of pregnancy Headache in pregnancy  - Discharge home in stable condition - Rx sent to pharmacy - Return precautions given. Return to MAU as needed for new/worsening symptoms - Keep OB appointment as scheduled   Brand Males, CNM 03/18/2022, 4:30 PM

## 2022-03-21 NOTE — L&D Delivery Note (Addendum)
Delivery Note At 4:12 PM a viable female was delivered via Vaginal, Spontaneous (Presentation: Left Occiput Anterior).  APGAR: 7, 9; weight 7 lb 1.2 oz (3210 g).  Tight nuchal cord was doubly clamped and cut.   Shoulders were delivered without difficulty, Right shoulder was anterior.  Moderate meconium stained fluid was noted at delivery.  Upon delivery, baby was placed on mother's abdomen where it was dried and stimulated and bulb suction used to clean nose and mouth.  IV pitocin bolus  infusion was given to the patient at this point. Baby did not have a spontaneous cry therefore it was taken to the warmer where further stimulation and suctioning of nose and mouth were done.  Baby then had a spontaneous cry and had good muscle tone.  Placenta was delivered with gentle traction on cord and abdominal counter traction.   Placenta status: Spontaneous, Intact.  Cord: 3 vessels with the following complications: None.  Cord pH: not collected.   Anesthesia: Epidural. Episiotomy: None. Lacerations: Superficial and hemostatic right labia majora tear.  Suture Repair:  N/A . Est. Blood Loss (mL): 150cc. Mom to postpartum.  Baby to Couplet care / Skin to Skin.  Prescilla Sours, MD.  07/15/2022, 5:19 PM

## 2022-06-22 ENCOUNTER — Inpatient Hospital Stay (HOSPITAL_COMMUNITY)
Admission: AD | Admit: 2022-06-22 | Discharge: 2022-06-22 | Disposition: A | Discharge status: Home / Self Care | Payer: BC Managed Care – PPO | Attending: Obstetrics and Gynecology | Admitting: Obstetrics and Gynecology

## 2022-06-22 ENCOUNTER — Encounter (HOSPITAL_COMMUNITY): Payer: Self-pay | Admitting: Obstetrics and Gynecology

## 2022-06-22 DIAGNOSIS — Z3A35 35 weeks gestation of pregnancy: Secondary | ICD-10-CM

## 2022-06-22 DIAGNOSIS — R079 Chest pain, unspecified: Secondary | ICD-10-CM | POA: Diagnosis not present

## 2022-06-22 DIAGNOSIS — R609 Edema, unspecified: Secondary | ICD-10-CM | POA: Diagnosis not present

## 2022-06-22 DIAGNOSIS — M549 Dorsalgia, unspecified: Secondary | ICD-10-CM | POA: Insufficient documentation

## 2022-06-22 DIAGNOSIS — O26893 Other specified pregnancy related conditions, third trimester: Secondary | ICD-10-CM | POA: Diagnosis not present

## 2022-06-22 DIAGNOSIS — R1011 Right upper quadrant pain: Secondary | ICD-10-CM | POA: Insufficient documentation

## 2022-06-22 DIAGNOSIS — R0789 Other chest pain: Secondary | ICD-10-CM | POA: Insufficient documentation

## 2022-06-22 DIAGNOSIS — O10913 Unspecified pre-existing hypertension complicating pregnancy, third trimester: Secondary | ICD-10-CM | POA: Diagnosis not present

## 2022-06-22 LAB — URINALYSIS, ROUTINE W REFLEX MICROSCOPIC
Bilirubin Urine: NEGATIVE
Glucose, UA: NEGATIVE mg/dL
Hgb urine dipstick: NEGATIVE
Ketones, ur: 20 mg/dL — AB
Nitrite: NEGATIVE
Protein, ur: NEGATIVE mg/dL
Specific Gravity, Urine: 1.005 (ref 1.005–1.030)
pH: 7 (ref 5.0–8.0)

## 2022-06-22 LAB — OB RESULTS CONSOLE GBS: GBS: POSITIVE

## 2022-06-22 NOTE — MAU Provider Note (Signed)
History     CSN: [id number]  Arrival date and time: 06/22/22 1736   None     Chief Complaint  Patient presents with   Hypertension   Foot Swelling   visual changes   Chest Pain   Hypertension Associated symptoms include chest pain. Pertinent negatives include no headaches or shortness of breath.  Chest Pain  Associated symptoms include abdominal pain, back pain, nausea and vomiting. Pertinent negatives include no cough, dizziness, fever, headaches or shortness of breath.  Her past medical history is significant for hypertension.   Pt is a 22 y.o. at [redacted]w[redacted]d presenting for chest pain, leg swelling, back pain, and RUQ pain. The leg swelling started 1 week ago and has worsened since the start. She is using compression socks which help with the swelling. She has also had intermittent chest pain for the past couple days. States the pain happens after exertion and lasts for 2-3 minutes. Pain is across the center of chest. Does not worsen with deep breath. No nausea, diaphoresis, or jaw/arm pain is present with the chest pain. The back pain has been going on for a few weeks and is in her lower back. It is aggravated by sitting. Pt states that heat does help. Pt has also had intermittent RUQ pain. States it occurs randomly and does not correlate with food.She reports normal BP through out pregnancy. Pt denies any recent travel and does not smoke. Denies fever, chills, headache, lightheadedness, dizziness, and shortness of breath. Denies CTX, LOF. VB. +FM  OB History     Gravida  1   Para      Term      Preterm      AB      Living         SAB      IAB      Ectopic      Multiple      Live Births              Past Medical History:  Diagnosis Date   Headache    No pertinent past medical history     Past Surgical History:  Procedure Laterality Date   NO PAST SURGERIES      Family History  Problem Relation Age of Onset   Asthma Mother     Social History    Tobacco Use   Smoking status: Never   Smokeless tobacco: Never  Vaping Use   Vaping Use: Every day   Substances: THC  Substance Use Topics   Alcohol use: No    Comment: 1 glass of wine within the last  wks   Drug use: Not Currently    Types: Marijuana    Allergies: No Known Allergies  Medications Prior to Admission  Medication Sig Dispense Refill Last Dose   acetaminophen-caffeine (EXCEDRIN TENSION HEADACHE) 500-65 MG TABS per tablet Take 2 tablets by mouth every 6 (six) hours as needed. 60 tablet 0    cholecalciferol (VITAMIN D3) 25 MCG (1000 UNIT) tablet Take 1,000 Units by mouth daily.      cyclobenzaprine (FLEXERIL) 10 MG tablet Take 1 tablet (10 mg total) by mouth 2 (two) times daily as needed for muscle spasms. 20 tablet 0    ondansetron (ZOFRAN-ODT) 4 MG disintegrating tablet Take 1 tablet (4 mg total) by mouth every 8 (eight) hours as needed for nausea or vomiting. 20 tablet 0    Prenatal Vit-Fe Fumarate-FA (M-NATAL PLUS) 27-1 MG TABS Take 1 tablet by mouth daily.  Review of Systems  Constitutional:  Negative for chills, fatigue and fever.  HENT:  Negative for congestion, rhinorrhea and sore throat.   Eyes:  Positive for visual disturbance.  Respiratory:  Negative for cough and shortness of breath.   Cardiovascular:  Positive for chest pain and leg swelling.  Gastrointestinal:  Positive for abdominal pain, constipation, nausea and vomiting. Negative for diarrhea.  Endocrine: Negative for polydipsia, polyphagia and polyuria.  Genitourinary:  Negative for dysuria, frequency, pelvic pain, urgency and vaginal bleeding.  Musculoskeletal:  Positive for back pain. Negative for arthralgias and myalgias.  Skin:  Negative for pallor and rash.  Neurological:  Negative for dizziness, syncope, light-headedness and headaches.  Psychiatric/Behavioral:  Negative for agitation and behavioral problems.    Physical Exam   Blood pressure 127/78, pulse 98, temperature 98.6 F (37  C), temperature source Oral, resp. rate 16, height 5\' 5"  (1.651 m), weight 97.8 kg, last menstrual period 10/15/2021, SpO2 98 %.  Physical Exam Constitutional:      General: She is not in acute distress.    Appearance: She is well-developed.  HENT:     Head: Normocephalic and atraumatic.  Eyes:     Extraocular Movements: Extraocular movements intact.     Pupils: Pupils are equal, round, and reactive to light.  Cardiovascular:     Rate and Rhythm: Normal rate and regular rhythm.     Heart sounds: Normal heart sounds.  Pulmonary:     Effort: Pulmonary effort is normal.     Breath sounds: Normal breath sounds.  Chest:     Chest wall: No tenderness.  Abdominal:     Tenderness: There is no abdominal tenderness.  Musculoskeletal:     Right lower leg: Edema (trace) present.     Left lower leg: Edema (trace) present.  Neurological:     General: No focal deficit present.     Mental Status: She is alert and oriented to person, place, and time.  Psychiatric:        Mood and Affect: Mood normal.        Behavior: Behavior normal.     MAU Course  Procedures  MDM FHT reassuring, category I HR 150, moderate variability, 15x15 accelerations, decelerations absent    Assessment and Plan  Chest Pain  -No alarming sx present with chest pain and that it is most likely not cardio/pulmonary related. Discussed that EKG could be done for further reassurance but pt declined -No risk factors of family hx of unprovoked blood clots - low suspicion for DVT/PE  RUQ pain Edema  -BP has been normal through out pregnancy and normal during today's visit, no concern for pre-eclampsia -Discussed that lower leg swelling is normal during this stage in pregnancy, continue wearing compression socks -Discussed pain could be due to baby position, gall stones, or constipation. -Recommended Miralax for constipation   Back Pain -Continue to use heat and tylenol PRN for back pain  5. 35w Gestation -Continue  regular prenatal care   [facility] 06/22/2022, 6:50 PM

## 2022-06-22 NOTE — MAU Note (Signed)
Natalie Ortega is a 22 y.o. at [redacted]w[redacted]d here in MAU reporting: past 2 days has been feeling nauseous(not currently) , threw up twice.  Vision has been blurring. Legs, ankles and feet have been swelling, increasing over the past wk. Started having chest pain a wk ago, thought it was heartburn, but then had that and this was not heartburn. (Like a muscle ache), has pain in lower back at the same time. Saw dr today, called them to get in because of symptoms.  Told her to come here, BP was up 167, went down after she laid down and relaxed for a few min. Denies HA, denies seeing spots now, has pain I RUQ- comes and goes, and does have swelling.  Denies BLeeding, can't tell if leaking. Reports +FM Currently not having any pain Onset of complaint: wk Pain score: none now Vitals:   06/22/22 1758  BP: 127/78  Pulse: 98  Resp: 16  Temp: 98.6 F (37 C)  SpO2: 98%     FHT:174 Lab orders placed from triage:    urine collected

## 2022-06-28 ENCOUNTER — Encounter (HOSPITAL_COMMUNITY): Payer: Self-pay | Admitting: Obstetrics and Gynecology

## 2022-06-28 ENCOUNTER — Inpatient Hospital Stay (HOSPITAL_COMMUNITY)
Admission: AD | Admit: 2022-06-28 | Discharge: 2022-06-28 | Disposition: A | Discharge status: Home / Self Care | Payer: BC Managed Care – PPO | Attending: Obstetrics and Gynecology | Admitting: Obstetrics and Gynecology

## 2022-06-28 DIAGNOSIS — O99333 Smoking (tobacco) complicating pregnancy, third trimester: Secondary | ICD-10-CM | POA: Insufficient documentation

## 2022-06-28 DIAGNOSIS — O10913 Unspecified pre-existing hypertension complicating pregnancy, third trimester: Secondary | ICD-10-CM | POA: Insufficient documentation

## 2022-06-28 DIAGNOSIS — D696 Thrombocytopenia, unspecified: Secondary | ICD-10-CM | POA: Diagnosis not present

## 2022-06-28 DIAGNOSIS — O99113 Other diseases of the blood and blood-forming organs and certain disorders involving the immune mechanism complicating pregnancy, third trimester: Secondary | ICD-10-CM | POA: Diagnosis present

## 2022-06-28 DIAGNOSIS — O113 Pre-existing hypertension with pre-eclampsia, third trimester: Secondary | ICD-10-CM | POA: Diagnosis not present

## 2022-06-28 DIAGNOSIS — Z3A36 36 weeks gestation of pregnancy: Secondary | ICD-10-CM | POA: Diagnosis not present

## 2022-06-28 DIAGNOSIS — D6959 Other secondary thrombocytopenia: Secondary | ICD-10-CM | POA: Insufficient documentation

## 2022-06-28 LAB — CBC WITH DIFFERENTIAL/PLATELET
Abs Immature Granulocytes: 0.04 10*3/uL (ref 0.00–0.07)
Basophils Absolute: 0 10*3/uL (ref 0.0–0.1)
Basophils Relative: 0 %
Eosinophils Absolute: 0.1 10*3/uL (ref 0.0–0.5)
Eosinophils Relative: 1 %
HCT: 31.1 % — ABNORMAL LOW (ref 36.0–46.0)
Hemoglobin: 11 g/dL — ABNORMAL LOW (ref 12.0–15.0)
Immature Granulocytes: 1 %
Lymphocytes Relative: 31 %
Lymphs Abs: 2 10*3/uL (ref 0.7–4.0)
MCH: 30.9 pg (ref 26.0–34.0)
MCHC: 35.4 g/dL (ref 30.0–36.0)
MCV: 87.4 fL (ref 80.0–100.0)
Monocytes Absolute: 0.4 10*3/uL (ref 0.1–1.0)
Monocytes Relative: 6 %
Neutro Abs: 4 10*3/uL (ref 1.7–7.7)
Neutrophils Relative %: 61 %
Platelets: 113 10*3/uL — ABNORMAL LOW (ref 150–400)
RBC: 3.56 MIL/uL — ABNORMAL LOW (ref 3.87–5.11)
RDW: 12.3 % (ref 11.5–15.5)
WBC: 6.6 10*3/uL (ref 4.0–10.5)
nRBC: 0 % (ref 0.0–0.2)

## 2022-06-28 LAB — PROTEIN / CREATININE RATIO, URINE
Creatinine, Urine: 237 mg/dL
Protein Creatinine Ratio: 0.16 mg/mg{Cre} — ABNORMAL HIGH (ref 0.00–0.15)
Total Protein, Urine: 37 mg/dL

## 2022-06-28 LAB — COMPREHENSIVE METABOLIC PANEL
ALT: 16 U/L (ref 0–44)
AST: 20 U/L (ref 15–41)
Albumin: 3 g/dL — ABNORMAL LOW (ref 3.5–5.0)
Alkaline Phosphatase: 104 U/L (ref 38–126)
Anion gap: 14 (ref 5–15)
BUN: <5 mg/dL — ABNORMAL LOW (ref 6–20)
CO2: 19 mmol/L — ABNORMAL LOW (ref 22–32)
Calcium: 9.1 mg/dL (ref 8.9–10.3)
Chloride: 103 mmol/L (ref 98–111)
Creatinine, Ser: 0.56 mg/dL (ref 0.44–1.00)
GFR, Estimated: >60 mL/min (ref >60)
Glucose, Bld: 140 mg/dL — ABNORMAL HIGH (ref 70–99)
Potassium: 2.8 mmol/L — ABNORMAL LOW (ref 3.5–5.1)
Sodium: 136 mmol/L (ref 135–145)
Total Bilirubin: 0.5 mg/dL (ref 0.3–1.2)
Total Protein: 6.4 g/dL — ABNORMAL LOW (ref 6.5–8.1)

## 2022-06-28 LAB — MAGNESIUM: Magnesium: 1.4 mg/dL — ABNORMAL LOW (ref 1.7–2.4)

## 2022-06-28 LAB — LACTATE DEHYDROGENASE: LDH: 133 U/L (ref 98–192)

## 2022-06-28 LAB — URIC ACID: Uric Acid, Serum: 5.2 mg/dL (ref 2.5–7.1)

## 2022-06-28 NOTE — MAU Provider Note (Cosign Needed Addendum)
History     CSN: [id number]  Arrival date and time: 06/28/22 1444   Event Date/Time   First Provider Initiated Contact with Patient 06/28/22 1515      Chief Complaint  Patient presents with   Hypertension   HPI 22 y.o. G1P0 at [redacted]w[redacted]d female with history of headache presents to MAU for s/s of preeclampsia. Patient was previously sent to MAU by OB/Gyn for suspected pre-eclampsia based on elevated blood pressure and low platelets. They wanted specific pre-eclampsia labs ran but that was not relayed. On arrival patients bp were WNL, and patient was discharged.   Since being seen at MAU it was recommended that the patient be started on steroids to increase platelet count by OB/GYN. Her BP was still elevated and she was again sent to MAU today for atypical pre-eclampsia workup. Patients' outpatient OB/Gyn's message and plan were relayed.  Patient denied any current HA, vision changes or abdominal pain OB History     Gravida  1   Para      Term      Preterm      AB      Living         SAB      IAB      Ectopic      Multiple      Live Births              Past Medical History:  Diagnosis Date   Headache     Past Surgical History:  Procedure Laterality Date   NO PAST SURGERIES      Family History  Problem Relation Age of Onset   Asthma Mother     Social History   Tobacco Use   Smoking status: Never   Smokeless tobacco: Never  Vaping Use   Vaping Use: Every day   Substances: THC  Substance Use Topics   Alcohol use: No    Comment: 1 glass of wine within the last  wks   Drug use: Not Currently    Types: Marijuana    Allergies: No Known Allergies  No medications prior to admission.    Review of Systems Physical Exam   Blood pressure 128/73, pulse 90, temperature 97.6 F (36.4 C), temperature source Oral, resp. rate 14, last menstrual period 10/15/2021, SpO2 98 %.  Physical Exam Vitals and nursing note reviewed.  Constitutional:       General: She is not in acute distress.    Appearance: She is well-developed. She is not ill-appearing.  HENT:     Head: Normocephalic and atraumatic.  Eyes:     General: No scleral icterus.       Right eye: No discharge.        Left eye: No discharge.     Conjunctiva/sclera: Conjunctivae normal.  Pulmonary:     Effort: Pulmonary effort is normal. No respiratory distress.  Neurological:     General: No focal deficit present.     Mental Status: She is alert.  Psychiatric:        Mood and Affect: Mood normal.        Behavior: Behavior normal.     MAU Course  Procedures  MDM -Pre eclampsia workup/labs: Protein/creatinine ratio, Uric acid, CBC w/ diff, CMP, Lactate dehydrogenase ordered. These labs will show if patient meets criteria for pre-eclampsia  - Mg was ordered due to low K.  - Patient was stable and labs did reflect further workup  Assessment and Plan   Assessment  -  36 week pregancy -Gestational thrombocytopenia  -anemia    Plan - Lab results: Potassium was low, Platelets also still low, other labs WNL -Patient was stable, and did not meet criteria for eclampsia.  - Discussed Return to clinic protocol  F/U with OB/GYN  Natalie Ortega 06/28/2022, 3:54 PM       Attestation of Supervision of Student:  I confirm that I have verified the information documented in the physician assistant student's note and that I have also personally performed the history, physical exam and all medical decision making activities.  I have verified that all services and findings are accurately documented in this student's note; and I agree with management and plan as outlined in the documentation. I have also made any necessary editorial changes.  History Natalie Ortega is a 22 y.o. G1P0 at [redacted]w[redacted]d who presents from the office for labs & BP monitoring. Patient being followed in the office for thrombocytopenia. Had labs drawn in the office the other day; office called her today with results &  told her to come to MAU for evaluation. Patient doesn't know what results were. Denies history of hypertension. Denies headache, visual disturbance, or epigastric pain. Reports good fetal movement.   Physical exam Patient Vitals for the past 24 hrs:  BP Temp Temp src Pulse Resp SpO2  06/28/22 1715 128/73 -- -- 90 -- 98 %  06/28/22 1700 128/76 -- -- 100 -- 100 %  06/28/22 1646 118/70 -- -- (!) 102 -- --  06/28/22 1644 (!) 129/90 -- -- (!) 104 -- --  06/28/22 1600 108/84 -- -- 90 -- 100 %  06/28/22 1546 120/67 -- -- 93 -- --  06/28/22 1531 (!) 110/58 -- -- 90 -- --  06/28/22 1516 131/71 -- -- 92 -- --  06/28/22 1507 138/77 97.6 F (36.4 C) Oral 97 14 94 %   Physical Examination: General appearance - alert, well appearing, and in no distress Mental status - alert, oriented to person, place, and time, normal mood, behavior, speech, dress, motor activity, and thought processes Eyes - pupils equal and reactive, extraocular eye movements intact, sclera anicteric Chest - normal respiratory effort  NST:  Baseline: 150 bpm, Variability: Good {> 6 bpm), Accelerations: Reactive, and Decelerations: Absent  Results for orders placed or performed during the hospital encounter of 06/28/22 (from the past 24 hour(s))  Protein / creatinine ratio, urine     Status: Abnormal   Collection Time: 06/28/22  3:09 PM  Result Value Ref Range   Creatinine, Urine 237 mg/dL   Total Protein, Urine 37 mg/dL   Protein Creatinine Ratio 0.16 (H) 0.00 - 0.15 mg/mg[Cre]  CBC with Differential/Platelet     Status: Abnormal   Collection Time: 06/28/22  3:42 PM  Result Value Ref Range   WBC 6.6 4.0 - 10.5 K/uL   RBC 3.56 (L) 3.87 - 5.11 MIL/uL   Hemoglobin 11.0 (L) 12.0 - 15.0 g/dL   HCT 30.9 (L) 40.7 - 68.0 %   MCV 87.4 80.0 - 100.0 fL   MCH 30.9 26.0 - 34.0 pg   MCHC 35.4 30.0 - 36.0 g/dL   RDW 88.1 10.3 - 15.9 %   Platelets 113 (L) 150 - 400 K/uL   nRBC 0.0 0.0 - 0.2 %   Neutrophils Relative % 61 %   Neutro  Abs 4.0 1.7 - 7.7 K/uL   Lymphocytes Relative 31 %   Lymphs Abs 2.0 0.7 - 4.0 K/uL   Monocytes Relative 6 %   Monocytes Absolute  0.4 0.1 - 1.0 K/uL   Eosinophils Relative 1 %   Eosinophils Absolute 0.1 0.0 - 0.5 K/uL   Basophils Relative 0 %   Basophils Absolute 0.0 0.0 - 0.1 K/uL   Immature Granulocytes 1 %   Abs Immature Granulocytes 0.04 0.00 - 0.07 K/uL  Comprehensive metabolic panel     Status: Abnormal   Collection Time: 06/28/22  3:42 PM  Result Value Ref Range   Sodium 136 135 - 145 mmol/L   Potassium 2.8 (L) 3.5 - 5.1 mmol/L   Chloride 103 98 - 111 mmol/L   CO2 19 (L) 22 - 32 mmol/L   Glucose, Bld 140 (H) 70 - 99 mg/dL   BUN <5 (L) 6 - 20 mg/dL   Creatinine, Ser 1.610.56 0.44 - 1.00 mg/dL   Calcium 9.1 8.9 - 09.610.3 mg/dL   Total Protein 6.4 (L) 6.5 - 8.1 g/dL   Albumin 3.0 (L) 3.5 - 5.0 g/dL   AST 20 15 - 41 U/L   ALT 16 0 - 44 U/L   Alkaline Phosphatase 104 38 - 126 U/L   Total Bilirubin 0.5 0.3 - 1.2 mg/dL   GFR, Estimated >04>60 >54>60 mL/min   Anion gap 14 5 - 15  Lactate dehydrogenase     Status: None   Collection Time: 06/28/22  3:42 PM  Result Value Ref Range   LDH 133 98 - 192 U/L  Uric acid     Status: None   Collection Time: 06/28/22  3:42 PM  Result Value Ref Range   Uric Acid, Serum 5.2 2.5 - 7.1 mg/dL  Magnesium     Status: Abnormal   Collection Time: 06/28/22  4:12 PM  Result Value Ref Range   Magnesium 1.4 (L) 1.7 - 2.4 mg/dL    MDM Normotensive in MAU except for one mildly elevated DBP. Asymptomatic. Normal LFTs, serum creatinine, and protein creatinine ratio.   [facility] CNM[facility] who states that patient was sent from office for preeclampsia work up and her most recent platelet count was 84. Patient has follow up in the office later this week.   Platelets improved from office and are now 113.  Assessment/Plan 1. Benign gestational thrombocytopenia in third trimester   2. [redacted] weeks gestation of pregnancy    -Reviewed reasons to return to  MAU -Keep scheduled for f/u this week in the office  Judeth HornLawrence, Natalie Clendenen, NP 06/28/2022 9:16 PM      Judeth HornErin Sasha Rogel, NP [facility] 06/28/2022 9:16 PM

## 2022-06-28 NOTE — MAU Note (Signed)
.  Natalie Ortega is a 22 y.o. at [redacted]w[redacted]d here in MAU reporting: the office called her today to tell her that her platelets are low and she needed to go to MAU immediately. Denies PIH s/sx. No VB or LOF. +FM>   Pain score: 9 pelvis, 6 bilateral rib pain that feels like stretching  Vitals:   06/28/22 1507  BP: 138/77  Pulse: 97  Resp: 14  Temp: 97.6 F (36.4 C)  SpO2: 94%     FHT:157

## 2022-07-15 ENCOUNTER — Inpatient Hospital Stay (HOSPITAL_COMMUNITY): Payer: BC Managed Care – PPO | Admitting: Anesthesiology

## 2022-07-15 ENCOUNTER — Other Ambulatory Visit: Payer: Self-pay

## 2022-07-15 ENCOUNTER — Encounter (HOSPITAL_COMMUNITY): Payer: Self-pay | Admitting: Obstetrics and Gynecology

## 2022-07-15 ENCOUNTER — Inpatient Hospital Stay (HOSPITAL_COMMUNITY)
Admission: AD | Admit: 2022-07-15 | Discharge: 2022-07-17 | DRG: 806 | Disposition: A | Discharge status: Home / Self Care | Payer: BC Managed Care – PPO | Attending: Obstetrics & Gynecology | Admitting: Obstetrics & Gynecology

## 2022-07-15 DIAGNOSIS — F17290 Nicotine dependence, other tobacco product, uncomplicated: Secondary | ICD-10-CM | POA: Diagnosis present

## 2022-07-15 DIAGNOSIS — D6959 Other secondary thrombocytopenia: Secondary | ICD-10-CM | POA: Diagnosis present

## 2022-07-15 DIAGNOSIS — O99284 Endocrine, nutritional and metabolic diseases complicating childbirth: Secondary | ICD-10-CM | POA: Diagnosis present

## 2022-07-15 DIAGNOSIS — D696 Thrombocytopenia, unspecified: Secondary | ICD-10-CM | POA: Diagnosis not present

## 2022-07-15 DIAGNOSIS — O133 Gestational [pregnancy-induced] hypertension without significant proteinuria, third trimester: Secondary | ICD-10-CM | POA: Diagnosis not present

## 2022-07-15 DIAGNOSIS — Z3A39 39 weeks gestation of pregnancy: Secondary | ICD-10-CM | POA: Diagnosis not present

## 2022-07-15 DIAGNOSIS — O134 Gestational [pregnancy-induced] hypertension without significant proteinuria, complicating childbirth: Secondary | ICD-10-CM | POA: Diagnosis not present

## 2022-07-15 DIAGNOSIS — D62 Acute posthemorrhagic anemia: Secondary | ICD-10-CM | POA: Diagnosis not present

## 2022-07-15 DIAGNOSIS — O479 False labor, unspecified: Secondary | ICD-10-CM

## 2022-07-15 DIAGNOSIS — O99334 Smoking (tobacco) complicating childbirth: Secondary | ICD-10-CM | POA: Diagnosis present

## 2022-07-15 DIAGNOSIS — O9902 Anemia complicating childbirth: Secondary | ICD-10-CM | POA: Diagnosis present

## 2022-07-15 DIAGNOSIS — E876 Hypokalemia: Secondary | ICD-10-CM | POA: Diagnosis present

## 2022-07-15 DIAGNOSIS — O99824 Streptococcus B carrier state complicating childbirth: Secondary | ICD-10-CM | POA: Diagnosis present

## 2022-07-15 DIAGNOSIS — O9912 Other diseases of the blood and blood-forming organs and certain disorders involving the immune mechanism complicating childbirth: Secondary | ICD-10-CM | POA: Diagnosis present

## 2022-07-15 DIAGNOSIS — O26893 Other specified pregnancy related conditions, third trimester: Secondary | ICD-10-CM | POA: Diagnosis present

## 2022-07-15 LAB — COMPREHENSIVE METABOLIC PANEL
ALT: 17 U/L (ref 0–44)
AST: 21 U/L (ref 15–41)
Albumin: 2.8 g/dL — ABNORMAL LOW (ref 3.5–5.0)
Alkaline Phosphatase: 96 U/L (ref 38–126)
Anion gap: 12 (ref 5–15)
BUN: <5 mg/dL — ABNORMAL LOW (ref 6–20)
CO2: 21 mmol/L — ABNORMAL LOW (ref 22–32)
Calcium: 9.1 mg/dL (ref 8.9–10.3)
Chloride: 103 mmol/L (ref 98–111)
Creatinine, Ser: 0.58 mg/dL (ref 0.44–1.00)
GFR, Estimated: >60 mL/min (ref >60)
Glucose, Bld: 107 mg/dL — ABNORMAL HIGH (ref 70–99)
Potassium: 3 mmol/L — ABNORMAL LOW (ref 3.5–5.1)
Sodium: 136 mmol/L (ref 135–145)
Total Bilirubin: 0.4 mg/dL (ref 0.3–1.2)
Total Protein: 5.7 g/dL — ABNORMAL LOW (ref 6.5–8.1)

## 2022-07-15 LAB — CBC
HCT: 30.4 % — ABNORMAL LOW (ref 36.0–46.0)
Hemoglobin: 10.6 g/dL — ABNORMAL LOW (ref 12.0–15.0)
MCH: 31.4 pg (ref 26.0–34.0)
MCHC: 34.9 g/dL (ref 30.0–36.0)
MCV: 89.9 fL (ref 80.0–100.0)
Platelets: 105 10*3/uL — ABNORMAL LOW (ref 150–400)
RBC: 3.38 MIL/uL — ABNORMAL LOW (ref 3.87–5.11)
RDW: 12.6 % (ref 11.5–15.5)
WBC: 8.3 10*3/uL (ref 4.0–10.5)
nRBC: 0 % (ref 0.0–0.2)

## 2022-07-15 LAB — PROTEIN / CREATININE RATIO, URINE
Creatinine, Urine: 174 mg/dL
Protein Creatinine Ratio: 0.26 mg/mg{Cre} — ABNORMAL HIGH (ref 0.00–0.15)
Total Protein, Urine: 45 mg/dL

## 2022-07-15 LAB — RPR: RPR Ser Ql: NONREACTIVE

## 2022-07-15 MED ORDER — OXYCODONE-ACETAMINOPHEN 5-325 MG PO TABS: 2.0000 | ORAL_TABLET | ORAL | Status: DC | PRN

## 2022-07-15 MED ORDER — HYDRALAZINE HCL 20 MG/ML IJ SOLN: 10.0000 mg | INTRAMUSCULAR | Status: DC | PRN

## 2022-07-15 MED ORDER — SODIUM CHLORIDE 0.9 % IV SOLN
5.0000 10*6.[IU] | Freq: Once | INTRAVENOUS | Status: AC
  Administered 2022-07-15: 5 10*6.[IU] via INTRAVENOUS
  Filled 2022-07-15: qty 5

## 2022-07-15 MED ORDER — LACTATED RINGERS IV SOLN
500.0000 mL | Freq: Once | INTRAVENOUS | Status: AC
  Administered 2022-07-15: 500 mL via INTRAVENOUS

## 2022-07-15 MED ORDER — ONDANSETRON HCL 4 MG PO TABS: 4.0000 mg | ORAL_TABLET | ORAL | Status: DC | PRN

## 2022-07-15 MED ORDER — OXYCODONE HCL 5 MG PO TABS: 10.0000 mg | ORAL_TABLET | ORAL | Status: DC | PRN

## 2022-07-15 MED ORDER — EPHEDRINE 5 MG/ML INJ: 10.0000 mg | INTRAVENOUS | Status: DC | PRN

## 2022-07-15 MED ORDER — LIDOCAINE HCL (PF) 1 % IJ SOLN: 30.0000 mL | INTRAMUSCULAR | Status: DC | PRN

## 2022-07-15 MED ORDER — ONDANSETRON HCL 4 MG/2ML IJ SOLN: 4.0000 mg | Freq: Four times a day (QID) | INTRAMUSCULAR | Status: DC | PRN

## 2022-07-15 MED ORDER — ACETAMINOPHEN 325 MG PO TABS
650.0000 mg | ORAL_TABLET | ORAL | Status: DC | PRN
  Administered 2022-07-16: 650 mg via ORAL
  Filled 2022-07-15: qty 2

## 2022-07-15 MED ORDER — LIDOCAINE HCL (PF) 1 % IJ SOLN
INTRAMUSCULAR | Status: DC | PRN
  Administered 2022-07-15 (×2): 4 mL via EPIDURAL

## 2022-07-15 MED ORDER — OXYCODONE HCL 5 MG PO TABS: 5.0000 mg | ORAL_TABLET | ORAL | Status: DC | PRN

## 2022-07-15 MED ORDER — LACTATED RINGERS IV SOLN: INTRAVENOUS | Status: DC

## 2022-07-15 MED ORDER — SENNOSIDES-DOCUSATE SODIUM 8.6-50 MG PO TABS
2.0000 | ORAL_TABLET | Freq: Every day | ORAL | Status: DC
  Administered 2022-07-16 – 2022-07-17 (×2): 2 via ORAL
  Filled 2022-07-15 (×2): qty 2

## 2022-07-15 MED ORDER — SIMETHICONE 80 MG PO CHEW: 80.0000 mg | CHEWABLE_TABLET | ORAL | Status: DC | PRN

## 2022-07-15 MED ORDER — BENZOCAINE-MENTHOL 20-0.5 % EX AERO: 1.0000 | INHALATION_SPRAY | CUTANEOUS | Status: DC | PRN

## 2022-07-15 MED ORDER — NIFEDIPINE ER OSMOTIC RELEASE 30 MG PO TB24
30.0000 mg | ORAL_TABLET | Freq: Every day | ORAL | Status: DC
  Administered 2022-07-15 – 2022-07-16 (×2): 30 mg via ORAL
  Filled 2022-07-15 (×2): qty 1

## 2022-07-15 MED ORDER — WITCH HAZEL-GLYCERIN EX PADS: 1.0000 | MEDICATED_PAD | CUTANEOUS | Status: DC | PRN

## 2022-07-15 MED ORDER — OXYTOCIN-SODIUM CHLORIDE 30-0.9 UT/500ML-% IV SOLN
2.5000 [IU]/h | INTRAVENOUS | Status: DC
  Administered 2022-07-15: 2.5 [IU]/h via INTRAVENOUS
  Filled 2022-07-15: qty 500

## 2022-07-15 MED ORDER — DIBUCAINE (PERIANAL) 1 % EX OINT: 1.0000 | TOPICAL_OINTMENT | CUTANEOUS | Status: DC | PRN

## 2022-07-15 MED ORDER — PENICILLIN G POT IN DEXTROSE 60000 UNIT/ML IV SOLN
3.0000 10*6.[IU] | INTRAVENOUS | Status: DC
  Administered 2022-07-15 (×2): 3 10*6.[IU] via INTRAVENOUS
  Filled 2022-07-15 (×2): qty 50

## 2022-07-15 MED ORDER — POTASSIUM CHLORIDE 10 MEQ/100ML IV SOLN
10.0000 meq | INTRAVENOUS | Status: AC
  Filled 2022-07-15 (×2): qty 100

## 2022-07-15 MED ORDER — OXYTOCIN-SODIUM CHLORIDE 30-0.9 UT/500ML-% IV SOLN: 2.5000 [IU]/h | INTRAVENOUS | Status: DC | PRN

## 2022-07-15 MED ORDER — FENTANYL CITRATE (PF) 100 MCG/2ML IJ SOLN: 50.0000 ug | INTRAMUSCULAR | Status: DC | PRN

## 2022-07-15 MED ORDER — IBUPROFEN 600 MG PO TABS
600.0000 mg | ORAL_TABLET | Freq: Four times a day (QID) | ORAL | Status: DC
  Administered 2022-07-15 – 2022-07-17 (×5): 600 mg via ORAL
  Filled 2022-07-15 (×7): qty 1

## 2022-07-15 MED ORDER — SODIUM CHLORIDE 0.9% FLUSH: 3.0000 mL | Freq: Two times a day (BID) | INTRAVENOUS | Status: DC

## 2022-07-15 MED ORDER — HYDROXYZINE HCL 50 MG PO TABS: 50.0000 mg | ORAL_TABLET | Freq: Four times a day (QID) | ORAL | Status: DC | PRN

## 2022-07-15 MED ORDER — OXYTOCIN BOLUS FROM INFUSION
333.0000 mL | Freq: Once | INTRAVENOUS | Status: AC
  Administered 2022-07-15: 333 mL via INTRAVENOUS

## 2022-07-15 MED ORDER — SODIUM CHLORIDE 0.9 % IV SOLN: 250.0000 mL | INTRAVENOUS | Status: DC | PRN

## 2022-07-15 MED ORDER — DIPHENHYDRAMINE HCL 50 MG/ML IJ SOLN: 12.5000 mg | INTRAMUSCULAR | Status: DC | PRN

## 2022-07-15 MED ORDER — LABETALOL HCL 5 MG/ML IV SOLN: 20.0000 mg | INTRAVENOUS | Status: DC | PRN

## 2022-07-15 MED ORDER — COCONUT OIL OIL: 1.0000 | TOPICAL_OIL | Status: DC | PRN

## 2022-07-15 MED ORDER — POTASSIUM CHLORIDE 10 MEQ/100ML IV SOLN
10.0000 meq | INTRAVENOUS | Status: AC
  Administered 2022-07-15 (×2): 10 meq via INTRAVENOUS
  Filled 2022-07-15 (×2): qty 100

## 2022-07-15 MED ORDER — ACETAMINOPHEN 325 MG PO TABS: 650.0000 mg | ORAL_TABLET | ORAL | Status: DC | PRN

## 2022-07-15 MED ORDER — OXYCODONE-ACETAMINOPHEN 5-325 MG PO TABS: 1.0000 | ORAL_TABLET | ORAL | Status: DC | PRN

## 2022-07-15 MED ORDER — SOD CITRATE-CITRIC ACID 500-334 MG/5ML PO SOLN: 30.0000 mL | ORAL | Status: DC | PRN

## 2022-07-15 MED ORDER — LACTATED RINGERS IV SOLN: 500.0000 mL | INTRAVENOUS | Status: DC | PRN

## 2022-07-15 MED ORDER — FENTANYL-BUPIVACAINE-NACL 0.5-0.125-0.9 MG/250ML-% EP SOLN
12.0000 mL/h | EPIDURAL | Status: DC | PRN
  Administered 2022-07-15: 12 mL/h via EPIDURAL
  Filled 2022-07-15: qty 250

## 2022-07-15 MED ORDER — PRENATAL MULTIVITAMIN CH
1.0000 | ORAL_TABLET | Freq: Every day | ORAL | Status: DC
  Administered 2022-07-16: 1 via ORAL
  Filled 2022-07-15: qty 1

## 2022-07-15 MED ORDER — DIPHENHYDRAMINE HCL 25 MG PO CAPS: 25.0000 mg | ORAL_CAPSULE | Freq: Four times a day (QID) | ORAL | Status: DC | PRN

## 2022-07-15 MED ORDER — ONDANSETRON HCL 4 MG/2ML IJ SOLN: 4.0000 mg | INTRAMUSCULAR | Status: DC | PRN

## 2022-07-15 MED ORDER — PHENYLEPHRINE 80 MCG/ML (10ML) SYRINGE FOR IV PUSH (FOR BLOOD PRESSURE SUPPORT): 80.0000 ug | PREFILLED_SYRINGE | INTRAVENOUS | Status: DC | PRN

## 2022-07-15 MED ORDER — ZOLPIDEM TARTRATE 5 MG PO TABS: 5.0000 mg | ORAL_TABLET | Freq: Every evening | ORAL | Status: DC | PRN

## 2022-07-15 MED ORDER — LABETALOL HCL 5 MG/ML IV SOLN: 80.0000 mg | INTRAVENOUS | Status: DC | PRN

## 2022-07-15 MED ORDER — SODIUM CHLORIDE 0.9% FLUSH: 3.0000 mL | INTRAVENOUS | Status: DC | PRN

## 2022-07-15 MED ORDER — LABETALOL HCL 5 MG/ML IV SOLN: 40.0000 mg | INTRAVENOUS | Status: DC | PRN

## 2022-07-15 NOTE — MAU Note (Signed)
1610- RN to bedside requesting patient to give urine sample. Patient verbalized inability to give urine sample at this time due to painful contractions.

## 2022-07-15 NOTE — Lactation Note (Signed)
This note was copied from a baby's chart. Lactation Consultation Note  Patient Name: Natalie Ortega [id number] Date: 07/15/2022 Age:23 hours   Per RN Toney Reil), Birth Parent would like help with latch assistance at next feeding at 11:30 pm.   Maternal Data    Feeding    LATCH Score                    Lactation Tools Discussed/Used    Interventions    Discharge    Consult Status      Frederico Hamman 07/15/2022, 9:28 PM

## 2022-07-15 NOTE — Anesthesia Preprocedure Evaluation (Signed)
Anesthesia Evaluation  Patient identified by MRN, date of birth, ID band Patient awake    Reviewed: Allergy & Precautions, Patient's Chart, lab work & pertinent test results  History of Anesthesia Complications Negative for: history of anesthetic complications  Airway Mallampati: II  TM Distance: >3 FB Neck ROM: Full    Dental no notable dental hx.    Pulmonary neg pulmonary ROS   Pulmonary exam normal        Cardiovascular negative cardio ROS Normal cardiovascular exam     Neuro/Psych  Headaches    GI/Hepatic negative GI ROS, Neg liver ROS,,,  Endo/Other  negative endocrine ROS    Renal/GU negative Renal ROS  negative genitourinary   Musculoskeletal negative musculoskeletal ROS (+)    Abdominal   Peds  Hematology  (+) Blood dyscrasia (Hgb 10.6, Plt 105k), anemia   Anesthesia Other Findings Day of surgery medications reviewed with patient.  Reproductive/Obstetrics (+) Pregnancy                              Anesthesia Physical Anesthesia Plan  ASA: 2  Anesthesia Plan: Epidural   Post-op Pain Management:    Induction:   PONV Risk Score and Plan: 2 and Treatment may vary due to age or medical condition  Airway Management Planned: Natural Airway  Additional Equipment: Fetal Monitoring  Intra-op Plan:   Post-operative Plan:   Informed Consent: I have reviewed the patients History and Physical, chart, labs and discussed the procedure including the risks, benefits and alternatives for the proposed anesthesia with the patient or authorized representative who has indicated his/her understanding and acceptance.       Plan Discussed with: CRNA  Anesthesia Plan Comments:          Anesthesia Quick Evaluation

## 2022-07-15 NOTE — MAU Note (Signed)
.  Natalie Ortega is a 22 y.o. at [redacted]w[redacted]d here in MAU reporting:   Contractions every: 4-5 minutes Onset of ctx: Today patient woke up with ctxn at approx 0000 Pain score: Pain Assessment Pain Assessment: 0-10 Pain Score: 10-Worst pain ever Pain Location: Abdomen Pain Orientation: Lower Pain Descriptors / Indicators: Contraction Pain Frequency: Intermittent Pain Onset: On-going Pain Intervention(s): RN made aware  ROM: Membranes Sac Identifier: Sac 1 Membrane Status: Intact Amount: None  Vaginal Bleeding: Vaginal Bleeding Vag. Bleeding: None  Last SVE: 0 cm last week   Epidural: Undecided   Fetal Movement: Reports positive FM  FHT: Fetal Heart Rate Mode: External Baseline Rate (A): 144 bpm Multiple birth?: No  Vitals:   07/15/22 0217  BP: (!) 138/95  Pulse: 90  Resp: 20  Temp: 98 F (36.7 C)  SpO2: 97%       OB Office: [facility] GBS: Positive HSV: Denies hx of HSV Lab orders placed from triage: MAU Labor Eval

## 2022-07-15 NOTE — H&P (Addendum)
Natalie Ortega is a 22 y.o. female presenting with labor contractions. In the MAU she progressed from 1.5 cm dilation to 3.5 cm dilation. She denies leakage of fluid. With normal fetal movement.    G1P0 at [redacted] weeks EGA, LMP 10/15/21, EDC by LMP and consistent with first trimester U/S, EDC of 07/22/22.   Prenatal care has been significant for gestational thrombocytopenia, with platelets as low as 84 on 06/23/22.    OB History     Gravida  1   Para      Term      Preterm      AB      Living         SAB      IAB      Ectopic      Multiple      Live Births             Past Medical History:  Diagnosis Date   Headache    Past Surgical History:  Procedure Laterality Date   NO PAST SURGERIES     Family History: family history includes Asthma in her mother. Social History:  reports that she has never smoked. She has never used smokeless tobacco. She reports that she does not currently use drugs after having used the following drugs: Marijuana. She reports that she does not drink alcohol.    Maternal Diabetes: No Genetic Screening: Normal Maternal Ultrasounds/Referrals: Normal Fetal Ultrasounds or other Referrals:  None Maternal Substance Abuse:  No Significant Maternal Medications:  None Significant Maternal Lab Results:  Group B Strep positive Number of Prenatal Visits:greater than 3 verified prenatal visits Other Comments:  None  Review of Systems Constitutional: Denies fevers/chills Cardiovascular: Denies chest pain or palpitations Pulmonary: Denies coughing or wheezing Gastrointestinal: Denies nausea, vomiting or diarrhea Genitourinary: Denies unusual vaginal bleeding, unusual vaginal discharge, dysuria, urgency or frequency.  Musculoskeletal: Denies muscle or joint aches and pain.  Neurology: Denies abnormal sensations such as tingling or numbness.    History Dilation: 5 Effacement (%): 90 Station: -2 Exam by:: [place] Blood pressure (!)  143/83, pulse 82, temperature (!) 97.4 F (36.3 C), temperature source Oral, resp. rate 16, height 5\' 4"  (1.626 m), weight 97.1 kg, last menstrual period 10/15/2021, SpO2 100 %.    07/15/2022    2:30 PM 07/15/2022    2:01 PM 07/15/2022    1:30 PM  Vitals with BMI  Systolic 137 144 956  Diastolic 70 82 94  Pulse 99 97 102    Exam Physical Exam  Constitutional: She is oriented to person, place, and time. She appears well-developed and well-nourished.  HENT:  Head: Normocephalic and atraumatic.  Neck: Normal range of motion.  Cardiovascular: Normal rate.    Respiratory: Effort normal.   GI: Soft.  Skin: Skin is warm and dry.  Psychiatric: She has a normal mood and affect. Her behavior is normal.   Genitourinary: Gravid uterus, appropriate for gestational age.   EFW 8 lbs. With adequate pelvis.   NST: 140 baseline, moderate variability, reactive. TOCO: Contractions every 3 to 4 minutes.    Current Outpatient Medications  Medication Instructions   acetaminophen-caffeine (EXCEDRIN TENSION HEADACHE) 500-65 MG TABS per tablet 2 tablets, Oral, Every 6 hours PRN   cholecalciferol (VITAMIN D3) 1,000 Units, Oral, Daily   cyclobenzaprine (FLEXERIL) 10 mg, Oral, 2 times daily PRN   ferrous sulfate 325 mg, Oral, Daily   ondansetron (ZOFRAN-ODT) 4 mg, Oral, Every 8 hours PRN   Prenatal Vit-Fe  Fumarate-FA (M-NATAL PLUS) 27-1 MG TABS 1 tablet, Oral, Daily    No Known Allergies   Prenatal labs: ABO, Rh: --/--/O POS (09/03 1255) Antibody:  Negative Rubella:  Immune  RPR:   NR HBsAg:  Negative HIV: Non Reactive (09/03 1255)  GBS:   Positive.   Recent Results (from the past 2160 hour(s))  OB RESULT CONSOLE Group B Strep     Status: None   Collection Time: 06/22/22 12:00 AM  Result Value Ref Range   GBS Positive   Urinalysis, Routine w reflex microscopic -Urine, Clean Catch     Status: Abnormal   Collection Time: 06/22/22  6:47 PM  Result Value Ref Range   Color, Urine YELLOW YELLOW    APPearance HAZY (A) CLEAR   Specific Gravity, Urine 1.005 1.005 - 1.030   pH 7.0 5.0 - 8.0   Glucose, UA NEGATIVE NEGATIVE mg/dL   Hgb urine dipstick NEGATIVE NEGATIVE   Bilirubin Urine NEGATIVE NEGATIVE   Ketones, ur 20 (A) NEGATIVE mg/dL   Protein, ur NEGATIVE NEGATIVE mg/dL   Nitrite NEGATIVE NEGATIVE   Leukocytes,Ua LARGE (A) NEGATIVE   RBC / HPF 0-5 0 - 5 RBC/hpf   WBC, UA 21-50 0 - 5 WBC/hpf   Bacteria, UA RARE (A) NONE SEEN   Squamous Epithelial / HPF 0-5 0 - 5 /HPF   Mucus PRESENT     Comment: Performed at [facility], [address], [place], [place] [address]  Protein / creatinine ratio, urine     Status: Abnormal   Collection Time: 06/28/22  3:09 PM  Result Value Ref Range   Creatinine, Urine 237 mg/dL   Total Protein, Urine 37 mg/dL    Comment: NO NORMAL RANGE ESTABLISHED FOR THIS TEST   Protein Creatinine Ratio 0.16 (H) 0.00 - 0.15 mg/mg[Cre]    Comment: Performed at [facility], [address], [place], [place] [address]  CBC with Differential/Platelet     Status: Abnormal   Collection Time: 06/28/22  3:42 PM  Result Value Ref Range   WBC 6.6 4.0 - 10.5 K/uL   RBC 3.56 (L) 3.87 - 5.11 MIL/uL   Hemoglobin 11.0 (L) 12.0 - 15.0 g/dL   HCT 09.8 (L) 11.9 - 14.7 %   MCV 87.4 80.0 - 100.0 fL   MCH 30.9 26.0 - 34.0 pg   MCHC 35.4 30.0 - 36.0 g/dL   RDW 82.9 56.2 - 13.0 %   Platelets 113 (L) 150 - 400 K/uL   nRBC 0.0 0.0 - 0.2 %   Neutrophils Relative % 61 %   Neutro Abs 4.0 1.7 - 7.7 K/uL   Lymphocytes Relative 31 %   Lymphs Abs 2.0 0.7 - 4.0 K/uL   Monocytes Relative 6 %   Monocytes Absolute 0.4 0.1 - 1.0 K/uL   Eosinophils Relative 1 %   Eosinophils Absolute 0.1 0.0 - 0.5 K/uL   Basophils Relative 0 %   Basophils Absolute 0.0 0.0 - 0.1 K/uL   Immature Granulocytes 1 %   Abs Immature Granulocytes 0.04 0.00 - 0.07 K/uL    Comment: Performed at [facility], [address] [place], [place] [address]  Comprehensive metabolic panel     Status:  Abnormal   Collection Time: 06/28/22  3:42 PM  Result Value Ref Range   Sodium 136 135 - 145 mmol/L   Potassium 2.8 (L) 3.5 - 5.1 mmol/L   Chloride 103 98 - 111 mmol/L   CO2 19 (L) 22 -  32 mmol/L   Glucose, Bld 140 (H) 70 - 99 mg/dL    Comment: Glucose reference range applies only to samples taken after fasting for at least 8 hours.   BUN <5 (L) 6 - 20 mg/dL   Creatinine, Ser 8.41 0.44 - 1.00 mg/dL   Calcium 9.1 8.9 - 32.4 mg/dL   Total Protein 6.4 (L) 6.5 - 8.1 g/dL   Albumin 3.0 (L) 3.5 - 5.0 g/dL   AST 20 15 - 41 U/L   ALT 16 0 - 44 U/L   Alkaline Phosphatase 104 38 - 126 U/L   Total Bilirubin 0.5 0.3 - 1.2 mg/dL   GFR, Estimated >40 >10 mL/min    Comment: (NOTE) Calculated using the CKD-EPI Creatinine Equation (2021)    Anion gap 14 5 - 15    Comment: Performed at [facility], [address], [place], [place] [address]  Lactate dehydrogenase     Status: None   Collection Time: 06/28/22  3:42 PM  Result Value Ref Range   LDH 133 98 - 192 U/L    Comment: Performed at [facility], [address] [place], [place] [address]  Uric acid     Status: None   Collection Time: 06/28/22  3:42 PM  Result Value Ref Range   Uric Acid, Serum 5.2 2.5 - 7.1 mg/dL    Comment: Performed at [facility], [address] [place], [place] [address]  Magnesium     Status: Abnormal   Collection Time: 06/28/22  4:12 PM  Result Value Ref Range   Magnesium 1.4 (L) 1.7 - 2.4 mg/dL    Comment: Performed at [facility], [address] [place], [place] [address]  CBC     Status: Abnormal   Collection Time: 07/15/22  3:22 AM  Result Value Ref Range   WBC 8.3 4.0 - 10.5 K/uL   RBC 3.38 (L) 3.87 - 5.11 MIL/uL   Hemoglobin 10.6 (L) 12.0 - 15.0 g/dL   HCT 87.5 (L) 64.3 - 32.9 %   MCV 89.9 80.0 - 100.0 fL   MCH 31.4 26.0 - 34.0 pg   MCHC 34.9 30.0 - 36.0 g/dL   RDW 51.8 84.1 - 66.0 %   Platelets 105 (L) 150 - 400 K/uL    Comment: Immature Platelet Fraction may  be clinically indicated, consider ordering this additional test [id number] REPEATED TO VERIFY    nRBC 0.0 0.0 - 0.2 %    Comment: Performed at [facility], [address], [place], [place] [address]  Comprehensive metabolic panel     Status: Abnormal   Collection Time: 07/15/22  3:22 AM  Result Value Ref Range   Sodium 136 135 - 145 mmol/L   Potassium 3.0 (L) 3.5 - 5.1 mmol/L   Chloride 103 98 - 111 mmol/L   CO2 21 (L) 22 - 32 mmol/L   Glucose, Bld 107 (H) 70 - 99 mg/dL    Comment: Glucose reference range applies only to samples taken after fasting for at least 8 hours.   BUN <5 (L) 6 - 20 mg/dL   Creatinine, Ser 5.73 0.44 - 1.00 mg/dL   Calcium 9.1 8.9 - 22.0 mg/dL   Total Protein 5.7 (L) 6.5 - 8.1 g/dL   Albumin 2.8 (L) 3.5 - 5.0 g/dL   AST 21 15 - 41 U/L   ALT 17 0 - 44 U/L   Alkaline Phosphatase 96 38 - 126 U/L  Total Bilirubin 0.4 0.3 - 1.2 mg/dL   GFR, Estimated >81 >19 mL/min    Comment: (NOTE) Calculated using the CKD-EPI Creatinine Equation (2021)    Anion gap 12 5 - 15    Comment: Performed at [facility], [address] [place], [place] [address]  RPR     Status: None   Collection Time: 07/15/22  7:08 AM  Result Value Ref Range   RPR Ser Ql NON REACTIVE NON REACTIVE    Comment: Performed at [facility], [address] [place], [place] [address]    Assessment/Plan: 22 y/o G1P0 at [redacted] weeks EGA in labor, - Admit to Labor and delivery unit as per admit orders. - Penicillin for GBS prophylaxis. - Anticipate NSVD.  - Potassium repletion and reassess tomorrow AM.   Tionne Dayhoff W Ermine Stebbins 07/15/2022, 9:02 AM

## 2022-07-15 NOTE — Anesthesia Procedure Notes (Signed)
Epidural Patient location during procedure: OB Start time: 07/15/2022 7:40 AM End time: 07/15/2022 7:43 AM  Staffing Anesthesiologist: Kaylyn Layer, MD Performed: anesthesiologist   Preanesthetic Checklist Completed: patient identified, IV checked, risks and benefits discussed, monitors and equipment checked, pre-op evaluation and timeout performed  Epidural Patient position: sitting Prep: DuraPrep and site prepped and draped Patient monitoring: continuous pulse ox, blood pressure and heart rate Approach: midline Location: L3-L4 Injection technique: LOR air  Needle:  Needle type: Tuohy  Needle gauge: 17 G Needle length: 9 cm Needle insertion depth: 6 cm Catheter type: closed end flexible Catheter size: 19 Gauge Catheter at skin depth: 11 cm Test dose: negative and Other (1% lidocaine)  Assessment Events: blood not aspirated, no cerebrospinal fluid, injection not painful, no injection resistance, no paresthesia and negative IV test  Additional Notes Patient identified. Risks, benefits, and alternatives discussed with patient including but not limited to bleeding, infection, nerve damage, paralysis, failed block, incomplete pain control, headache, blood pressure changes, nausea, vomiting, reactions to medication, itching, and postpartum back pain. Confirmed with bedside nurse the patient's most recent platelet count. Confirmed with patient that they are not currently taking any anticoagulation, have any bleeding history, or any family history of bleeding disorders. Patient expressed understanding and wished to proceed. All questions were answered. Sterile technique was used throughout the entire procedure. Please see nursing notes for vital signs.   Crisp LOR on first pass. Test dose was given through epidural catheter and negative prior to continuing to dose epidural or start infusion. Warning signs of high block given to the patient including shortness of breath,  tingling/numbness in hands, complete motor block, or any concerning symptoms with instructions to call for help. Patient was given instructions on fall risk and not to get out of bed. All questions and concerns addressed with instructions to call with any issues or inadequate analgesia.  Reason for block:procedure for pain

## 2022-07-15 NOTE — MAU Provider Note (Signed)
Chief Complaint:  Contractions   Event Date/Time   First Provider Initiated Contact with Patient 07/15/22 0232     HPI: Natalie Ortega is a 22 y.o. G1P0 at 33w0dwho presents to maternity admissions reporting painful contractions since midnight.  I was asked to see her due to new hypertension.. She reports good fetal movement, denies LOF, vaginal bleeding, vaginal itching/burning, urinary symptoms, h/a, dizziness, n/v, diarrhea, constipation or fever/chills.  She denies headache, visual changes or RUQ abdominal pain.  Abdominal Pain This is a new problem. The current episode started today. The problem occurs intermittently. The pain is severe. The quality of the pain is cramping. Pertinent negatives include no diarrhea, dysuria, fever, frequency or headaches. Nothing aggravates the pain. The pain is relieved by Nothing. She has tried nothing for the symptoms.   RN note: Natalie Ortega is a 22 y.o. at [redacted]w[redacted]d here in MAU reporting:   Contractions every: 4-5 minutes Onset of ctx: Today patient woke up with ctxn at approx 0000  Past Medical History: Past Medical History:  Diagnosis Date   Headache     Past obstetric history: OB History  Gravida Para Term Preterm AB Living  1            SAB IAB Ectopic Multiple Live Births               # Outcome Date GA Lbr Len/2nd Weight Sex Delivery Anes PTL Lv  1 Current             Past Surgical History: Past Surgical History:  Procedure Laterality Date   NO PAST SURGERIES      Family History: Family History  Problem Relation Age of Onset   Asthma Mother     Social History: Social History   Tobacco Use   Smoking status: Never   Smokeless tobacco: Never  Vaping Use   Vaping Use: Every day   Substances: THC  Substance Use Topics   Alcohol use: No    Comment: 1 glass of wine within the last  wks   Drug use: Not Currently    Types: Marijuana    Allergies: No Known Allergies  Meds:  Medications Prior to Admission  Medication  Sig Dispense Refill Last Dose   cholecalciferol (VITAMIN D3) 25 MCG (1000 UNIT) tablet Take 1,000 Units by mouth daily.   07/14/2022   ferrous sulfate 325 (65 FE) MG tablet Take 325 mg by mouth daily.   07/14/2022   Prenatal Vit-Fe Fumarate-FA (M-NATAL PLUS) 27-1 MG TABS Take 1 tablet by mouth daily.   07/14/2022   acetaminophen-caffeine (EXCEDRIN TENSION HEADACHE) 500-65 MG TABS per tablet Take 2 tablets by mouth every 6 (six) hours as needed. 60 tablet 0    cyclobenzaprine (FLEXERIL) 10 MG tablet Take 1 tablet (10 mg total) by mouth 2 (two) times daily as needed for muscle spasms. 20 tablet 0    ondansetron (ZOFRAN-ODT) 4 MG disintegrating tablet Take 1 tablet (4 mg total) by mouth every 8 (eight) hours as needed for nausea or vomiting. 20 tablet 0     I have reviewed patient's Past Medical Hx, Surgical Hx, Family Hx, Social Hx, medications and allergies.   ROS:  Review of Systems  Constitutional:  Negative for fever.  Gastrointestinal:  Positive for abdominal pain. Negative for diarrhea.  Genitourinary:  Negative for dysuria and frequency.  Neurological:  Negative for headaches.   Other systems negative  Physical Exam  Patient Vitals for the past 24 hrs:  BP Temp Temp  src Pulse Resp SpO2 Height Weight  07/15/22 0217 (!) 138/95 98 F (36.7 C) Oral 90 20 97 % 5\' 4"  (1.626 m) 97.1 kg  145/83 148/87 148/94 140/101 146/92  Constitutional: Well-developed, well-nourished female in no acute distress, but quite uncomfortable.  Cardiovascular: normal rate and rhythm Respiratory: normal effort GI: Abd soft, non-tender, gravid appropriate for gestational age.   No rebound or guarding. MS: Extremities nontender, no edema, normal ROM Neurologic: Alert and oriented x 4.  DTRs 3+ with no clonus GU: Neg CVAT.  Dilation: 1.5 Effacement (%): 50 Cervical Position: Posterior, Middle Station: -2 Presentation: Undeterminable Exam by:: Henrine Screws RN  Dilation: 3 Effacement (%): 50 Cervical  Position: Posterior Station: -2 Presentation: Undeterminable Exam by:: A Sherlon Handing RN  FHT:  Baseline 140 , moderate variability, accelerations present, no decelerations Contractions: q 2-3 mins Irregular     Labs: Results for orders placed or performed during the hospital encounter of 07/15/22 (from the past 24 hour(s))  CBC     Status: Abnormal   Collection Time: 07/15/22  3:22 AM  Result Value Ref Range   WBC 8.3 4.0 - 10.5 K/uL   RBC 3.38 (L) 3.87 - 5.11 MIL/uL   Hemoglobin 10.6 (L) 12.0 - 15.0 g/dL   HCT 16.1 (L) 09.6 - 04.5 %   MCV 89.9 80.0 - 100.0 fL   MCH 31.4 26.0 - 34.0 pg   MCHC 34.9 30.0 - 36.0 g/dL   RDW 40.9 81.1 - 91.4 %   Platelets 105 (L) 150 - 400 K/uL   nRBC 0.0 0.0 - 0.2 %  Comprehensive metabolic panel     Status: Abnormal   Collection Time: 07/15/22  3:22 AM  Result Value Ref Range   Sodium 136 135 - 145 mmol/L   Potassium 3.0 (L) 3.5 - 5.1 mmol/L   Chloride 103 98 - 111 mmol/L   CO2 21 (L) 22 - 32 mmol/L   Glucose, Bld 107 (H) 70 - 99 mg/dL   BUN <5 (L) 6 - 20 mg/dL   Creatinine, Ser 7.82 0.44 - 1.00 mg/dL   Calcium 9.1 8.9 - 95.6 mg/dL   Total Protein 5.7 (L) 6.5 - 8.1 g/dL   Albumin 2.8 (L) 3.5 - 5.0 g/dL   AST 21 15 - 41 U/L   ALT 17 0 - 44 U/L   Alkaline Phosphatase 96 38 - 126 U/L   Total Bilirubin 0.4 0.3 - 1.2 mg/dL   GFR, Estimated >21 >30 mL/min   Anion gap 12 5 - 15    --/--/O POS (09/03 1255)  Imaging:  No results found.  MAU Course/MDM: I have reviewed the triage vital signs and the nursing notes.   Pertinent labs & imaging results that were available during my care of the patient were reviewed by me and considered in my medical decision making (see chart for details).      I have reviewed her medical records including past results, notes and treatments.   I have ordered labs and reviewed results. These are normal except for thrombocytopenia NST reviewed  Treatments in MAU included EFM, labs.    Assessment: Single IUP at  [redacted]w[redacted]d Latent phase labor New Gestational Hypertension vs Preeclampsia Thrombocytopenia  Plan: Admit to Labor and Delivery per Dr Richardson Dopp Plan per MD.  Wynelle Bourgeois CNM, MSN Certified Nurse-Midwife 07/15/2022 2:32 AM

## 2022-07-15 NOTE — Progress Notes (Signed)
Pt informed that the ultrasound is considered a limited OB ultrasound and is not intended to be a complete ultrasound exam.  Patient also informed that the ultrasound is not being completed with the intent of assessing for fetal or placental anomalies or any pelvic abnormalities.  Explained that the purpose of today's ultrasound is to assess for  presentation.  Patient acknowledges the purpose of the exam and the limitations of the study.    Cephalic presentation confirmed.  

## 2022-07-16 DIAGNOSIS — E876 Hypokalemia: Secondary | ICD-10-CM | POA: Diagnosis present

## 2022-07-16 DIAGNOSIS — D62 Acute posthemorrhagic anemia: Secondary | ICD-10-CM | POA: Diagnosis not present

## 2022-07-16 DIAGNOSIS — O134 Gestational [pregnancy-induced] hypertension without significant proteinuria, complicating childbirth: Secondary | ICD-10-CM | POA: Diagnosis not present

## 2022-07-16 LAB — BASIC METABOLIC PANEL
Anion gap: 8 (ref 5–15)
BUN: <5 mg/dL — ABNORMAL LOW (ref 6–20)
CO2: 22 mmol/L (ref 22–32)
Calcium: 8.8 mg/dL — ABNORMAL LOW (ref 8.9–10.3)
Chloride: 107 mmol/L (ref 98–111)
Creatinine, Ser: 0.59 mg/dL (ref 0.44–1.00)
GFR, Estimated: >60 mL/min (ref >60)
Glucose, Bld: 83 mg/dL (ref 70–99)
Potassium: 3.1 mmol/L — ABNORMAL LOW (ref 3.5–5.1)
Sodium: 137 mmol/L (ref 135–145)

## 2022-07-16 LAB — CBC
HCT: 28.7 % — ABNORMAL LOW (ref 36.0–46.0)
Hemoglobin: 9.9 g/dL — ABNORMAL LOW (ref 12.0–15.0)
MCH: 30.8 pg (ref 26.0–34.0)
MCHC: 34.5 g/dL (ref 30.0–36.0)
MCV: 89.4 fL (ref 80.0–100.0)
Platelets: 93 10*3/uL — ABNORMAL LOW (ref 150–400)
RBC: 3.21 MIL/uL — ABNORMAL LOW (ref 3.87–5.11)
RDW: 12.8 % (ref 11.5–15.5)
WBC: 11.2 10*3/uL — ABNORMAL HIGH (ref 4.0–10.5)
nRBC: 0 % (ref 0.0–0.2)

## 2022-07-16 MED ORDER — MAGNESIUM OXIDE -MG SUPPLEMENT 400 (240 MG) MG PO TABS
400.0000 mg | ORAL_TABLET | Freq: Every day | ORAL | Status: DC
  Administered 2022-07-16 – 2022-07-17 (×2): 400 mg via ORAL
  Filled 2022-07-16 (×2): qty 1

## 2022-07-16 MED ORDER — POTASSIUM CHLORIDE CRYS ER 20 MEQ PO TBCR
20.0000 meq | EXTENDED_RELEASE_TABLET | Freq: Two times a day (BID) | ORAL | Status: DC
  Administered 2022-07-16 – 2022-07-17 (×3): 20 meq via ORAL
  Filled 2022-07-16 (×3): qty 1

## 2022-07-16 MED ORDER — POLYSACCHARIDE IRON COMPLEX 150 MG PO CAPS
150.0000 mg | ORAL_CAPSULE | Freq: Every day | ORAL | Status: DC
  Administered 2022-07-16 – 2022-07-17 (×2): 150 mg via ORAL
  Filled 2022-07-16 (×2): qty 1

## 2022-07-16 NOTE — Anesthesia Postprocedure Evaluation (Signed)
Anesthesia Post Note  Patient: Natalie Ortega  Procedure(s) Performed: AN AD HOC LABOR EPIDURAL     Patient location during evaluation: Mother Baby Anesthesia Type: Epidural Level of consciousness: awake and alert Pain management: pain level controlled Vital Signs Assessment: post-procedure vital signs reviewed and stable Respiratory status: spontaneous breathing, nonlabored ventilation and respiratory function stable Cardiovascular status: stable Postop Assessment: no headache, no backache and epidural receding Anesthetic complications: no   No notable events documented.  Last Vitals:  Vitals:   07/16/22 0038 07/16/22 0413  BP: 136/80 (!) 137/91  Pulse: 85 88  Resp: 16 16  Temp: 37.1 C 36.6 C  SpO2: 100% 100%    Last Pain:  Vitals:   07/16/22 0413  TempSrc: Oral  PainSc:    Pain Goal:                   [name]

## 2022-07-16 NOTE — Progress Notes (Addendum)
CSW was consulted due to [facility] use during pregnancy. CSW met with MOB at bedside to complete assessment. When CSW entered room, MOB was observed sitting in hospital bed. MOB had a visitor present. CSW introduced self and requested to speak with MOB alone. Visitor left room. MOB presented as calm, was agreeable to consult and remained engaged throughout encounter.   CSW explained reason for consult and inquired about substance use during pregnancy. MOB denies using illicit substances during pregnancy, including marijuana. MOB reports her last use of marijuana was 11/2021, which is consistent with MOB's chart review.   Referral was screened out due to the following: ~MOB had no documented substance use after initial prenatal visit/+UPT. ~MOB had no positive drug screens after initial prenatal visit/+UPT.  Per MOB's H&P, MOB reports no current marijuana use as of 11/21/2021. However, under "Vaping Use" in H&P, it is noted that MOB smoked THC "every day". It appears that MOB's "Vaping Use" is a copy/paste beginning 02/22/2021.   Per RN, infant's urine drug screen has not yet been collected due to infant only voiding one time since birth in bath.   CSW informed MOB about hospital drug screen policy. CSW explained that infant's UDS and CDS would be monitored and a CPS report would be made if warranted. MOB expressed understanding.  Prior to exiting room, CSW inquired if MOB has any resource needs. MOB expressed interest in a [facility] referral, which CSW placed with MOB's verbal consent. WIC contact information was also provided.  CSW will continue to monitor infant's UDS and CDS and make a CPS report if warranted.   CSW identifies no further need for intervention and no barriers to infant's discharge at this time.   Signed,  Norberto Sorenson, MSW, Bryon Lions 07/16/2022 6:39 PM

## 2022-07-16 NOTE — Lactation Note (Signed)
This note was copied from a baby's chart. Lactation Consultation Note  Patient Name: Natalie Ortega [id number] Date: 07/16/2022 Age:23 hours Reason for consult: Initial assessment;1st time breastfeeding;Term  LC entered the room, infant was cuing to breastfeed. Birth Parent did reverse pressure softening and latched infant on her right breast using the football hold position. Infant was still breastfeeding after 15 minutes when LC left the room. Infant will continue to breastfeed infant by cues, on demand, 8 to 12+ times within 24 hours, skin to skin. Birth Parent knows to ask RN/LC for further latch assistance if needed. LC discussed infant's input and output and the importance of maternal rest, diet and hydration. Birth Parent was  made aware of O/P services, breastfeeding support groups, community resources, and our phone # for post-discharge questions.    Maternal Data Has patient been taught Hand Expression?: Yes Does the patient have breastfeeding experience prior to this delivery?: No  Feeding Mother's Current Feeding Choice: Breast Milk  LATCH Score Latch: Grasps breast easily, tongue down, lips flanged, rhythmical sucking.  Audible Swallowing: Spontaneous and intermittent  Type of Nipple: Everted at rest and after stimulation  Comfort (Breast/Nipple): Soft / non-tender  Hold (Positioning): Assistance needed to correctly position infant at breast and maintain latch.  LATCH Score: 9   Lactation Tools Discussed/Used    Interventions Interventions: Breast feeding basics reviewed;Adjust position;Assisted with latch;Support pillows;Skin to skin;Position options;Education;Breast massage;Expressed milk;Hand express;Breast compression;LC Services brochure  Discharge Pump: DEBP;Personal  Consult Status Consult Status: Follow-up Date: 07/16/22 Follow-up type: In-patient    Frederico Hamman 07/16/2022, 12:18 AM

## 2022-07-16 NOTE — Progress Notes (Addendum)
Post Partum Day 1 Subjective: no complaints, up ad lib, voiding, and tolerating PO  Objective: Blood pressure (!) 132/93, pulse 98, temperature 98.4 F (36.9 C), temperature source Oral, resp. rate 18, height 5\' 4"  (1.626 m), weight 97.1 kg, last menstrual period 10/15/2021, SpO2 100 %, unknown if currently breastfeeding.    07/16/2022    9:22 AM 07/16/2022    4:13 AM 07/16/2022   12:38 AM  Vitals with BMI  Systolic 132 137 161  Diastolic 93 91 80  Pulse 98 88 85    Physical Exam:  General: alert, cooperative, and no distress Lochia: appropriate Uterine Fundus: firm Incision: N/A DVT Evaluation: No evidence of DVT seen on physical exam. No significant calf/ankle edema.  Recent Labs    07/15/22 0322 07/16/22 0541  HGB 10.6* 9.9*  HCT 30.4* 28.7*      Latest Ref Rng & Units 07/16/2022    5:41 AM 07/15/2022    3:22 AM 06/28/2022    3:42 PM  BMP  Glucose 70 - 99 mg/dL 83  096  045   BUN 6 - 20 mg/dL <5  <5  <5   Creatinine 0.44 - 1.00 mg/dL 4.09  8.11  9.14   Sodium 135 - 145 mmol/L 137  136  136   Potassium 3.5 - 5.1 mmol/L 3.1  3.0  2.8   Chloride 98 - 111 mmol/L 107  103  103   CO2 22 - 32 mmol/L 22  21  19    Calcium 8.9 - 10.3 mg/dL 8.8  9.1  9.1      Current Facility-Administered Medications:    sodium chloride flush (NS) 0.9 % injection 3 mL, 3 mL, Intravenous, Q12H **AND** sodium chloride flush (NS) 0.9 % injection 3 mL, 3 mL, Intravenous, PRN **AND** 0.9 %  sodium chloride infusion, 250 mL, Intravenous, PRN, Sallye Ober, Janeann Paisley, MD   acetaminophen (TYLENOL) tablet 650 mg, 650 mg, Oral, Q4H PRN, Sallye Ober, Silveria Botz, MD, 650 mg at 07/16/22 0927   benzocaine-Menthol (DERMOPLAST) 20-0.5 % topical spray 1 Application, 1 Application, Topical, PRN, Sallye Ober, Patricio Popwell, MD   coconut oil, 1 Application, Topical, PRN, Hoover Browns, MD   witch hazel-glycerin (TUCKS) pad 1 Application, 1 Application, Topical, PRN **AND** dibucaine (NUPERCAINAL) 1 % rectal ointment 1 Application, 1 Application, Rectal, PRN,  Sallye Ober, Erin Uecker, MD   diphenhydrAMINE (BENADRYL) capsule 25 mg, 25 mg, Oral, Q6H PRN, Sallye Ober, Emmalie Haigh, MD   labetalol (NORMODYNE) injection 20 mg, 20 mg, Intravenous, PRN **AND** labetalol (NORMODYNE) injection 40 mg, 40 mg, Intravenous, PRN **AND** labetalol (NORMODYNE) injection 80 mg, 80 mg, Intravenous, PRN **AND** hydrALAZINE (APRESOLINE) injection 10 mg, 10 mg, Intravenous, PRN **AND** Measure blood pressure, , , Once, Gerald Leitz, MD   ibuprofen (ADVIL) tablet 600 mg, 600 mg, Oral, Q6H, Sallye Ober, Brisa Auth, MD, 600 mg at 07/15/22 2249   NIFEdipine (PROCARDIA-XL/NIFEDICAL-XL) 24 hr tablet 30 mg, 30 mg, Oral, Daily, Sallye Ober, Jessicia Napolitano, MD, 30 mg at 07/16/22 0927   ondansetron (ZOFRAN) tablet 4 mg, 4 mg, Oral, Q4H PRN **OR** ondansetron (ZOFRAN) injection 4 mg, 4 mg, Intravenous, Q4H PRN, Sallye Ober, Madell Heino, MD   oxyCODONE (Oxy IR/ROXICODONE) immediate release tablet 10 mg, 10 mg, Oral, Q4H PRN, Sallye Ober, Weldon Nouri, MD   oxyCODONE (Oxy IR/ROXICODONE) immediate release tablet 5 mg, 5 mg, Oral, Q4H PRN, Sallye Ober, Lauralie Blacksher, MD   oxytocin (PITOCIN) IV infusion 30 units in NS 500 mL - Premix, 2.5 Units/hr, Intravenous, Continuous PRN, Sallye Ober, Dalan Cowger, MD   potassium chloride SA (KLOR-CON M) CR tablet 20 mEq, 20 mEq, Oral,  BID, Hoover Browns, MD   prenatal multivitamin tablet 1 tablet, 1 tablet, Oral, Q1200, Hoover Browns, MD   senna-docusate (Senokot-S) tablet 2 tablet, 2 tablet, Oral, Daily, Hoover Browns, MD, 2 tablet at 07/16/22 0926   simethicone (MYLICON) chewable tablet 80 mg, 80 mg, Oral, PRN, Sallye Ober, Zeriah Baysinger, MD   zolpidem (AMBIEN) tablet 5 mg, 5 mg, Oral, QHS PRN, Hoover Browns, MD   Assessment/Plan: 22 y/o G1P1 PPD #1 s/p vaginal delivery, Plan for discharge tomorrow, Breastfeeding, and Circumcision prior to discharge. Potassium repletion and recheck BMP.  -I discussed with patient risks, benefits and alternatives of circumcision including risks of bleeding, infection, damage to organs and need for further procedures.  All her questions were answered and she  consented for the procedure to be done on her son.  - Procardia for elevated pressures, with gestational HTN.   LOS: 1 day   Prescilla Sours, MD 07/16/2022, 12:09 PM

## 2022-07-16 NOTE — Discharge Summary (Signed)
SVD OB Discharge Summary       Patient Name: Natalie Ortega DOB: [birth year] MRN: [id number]  Date of admission: 07/15/2022 Delivering MD: Hoover Browns  Date of delivery: 07/15/2022 Type of delivery: SVD  Newborn Data: Sex: Baby Female Circumcision: in pt circ prior to discharge Live born female  Birth Weight: 7 lb 1.2 oz (3210 g) APGAR: 7, 9  Newborn Delivery   Birth date/time: 07/15/2022 16:12:00 Delivery type: Vaginal, Spontaneous      Feeding: breast Infant being discharge to home with mother in stable condition.   Admitting diagnosis: Normal labor [O80, Z37.9] Intrauterine pregnancy: [redacted]w[redacted]d     Secondary diagnosis:  Principal Problem:   Normal labor Active Problems:   SVD (spontaneous vaginal delivery)   Gestational (pregnancy-induced) hypertension without significant proteinuria, complicating childbirth   Acute blood loss anemia   Hypokalemia   Hypomagnesemia   Normal postpartum course                                Complications: None                                                              Intrapartum Procedures: spontaneous vaginal delivery and GBS prophylaxis Postpartum Procedures: none Complications-Operative and Postpartum: none Augmentation: AROM   History of Present Illness: Natalie Ortega is a 22 y.o. female, G1P1001, who presents at [redacted]w[redacted]d weeks gestation. The patient has been followed at  [facility]  Her pregnancy has been complicated by:  Patient Active Problem List   Diagnosis Date Noted   SVD (spontaneous vaginal delivery) 07/16/2022   Gestational (pregnancy-induced) hypertension without significant proteinuria, complicating childbirth 07/16/2022   Acute blood loss anemia 07/16/2022   Hypokalemia 07/16/2022   Hypomagnesemia 07/16/2022   Normal postpartum course 07/16/2022   Normal labor 07/15/2022   Left knee injury 07/25/2016     Active Ambulatory Problems    Diagnosis Date Noted   Left knee injury  07/25/2016   Resolved Ambulatory Problems    Diagnosis Date Noted   No Resolved Ambulatory Problems   Past Medical History:  Diagnosis Date   Headache      Hospital course:  Onset of Labor With Vaginal Delivery      22 y.o. yo G1P1001 at [redacted]w[redacted]d was admitted in Latent Labor on 07/15/2022. Labor course was complicated bypt was admitted  on 4/26 for latent labor, gbs+ tx with PCN x3 doses, pt progress to SVD on 4/26 with AROM, over intact perineum, ebl was , hgb drop of 10.6-9.9, po iron, asymptomatic. Pt noted to develop GHTN, PCR was 0.26, other labs unremarkable for preE, noted to have hypokalemia with IV potassium given for 3.0 IV x2 doses. Pt also had gestational thrombocytopenia and 4/4 plat were 89, , platelets remain stable 105-93, hemodynamically stable.  Membrane Rupture Time/Date: 9:33 AM ,07/15/2022   Delivery Method:Vaginal, Spontaneous  Episiotomy: None  Lacerations:  None  Patient had a postpartum course complicated by pt continued to have elevated BP 130s-90s and was started on procardia 30mg  XL, with BP currently 136/97 and procardia was increased to 60mg  XL today.. Potassium PO continue PP BID 3.1-3.5 and corrected upon discharge and asymptomatic, and magnesium Ox 400mg   PO daily add, mag was 1.4 and asymptomatic. Marland Kitchen  She is ambulating, tolerating a regular diet, passing flatus, and urinating well. Patient is discharged home in stable condition on 07/17/22.  Newborn Data: Birth date:07/15/2022  Birth time:4:12 PM  Gender:Female  Living status:Living  Apgars:7 ,9  Weight:3210 g  Postpartum Day # 2 : Patient up ad lib, denies syncope or dizziness. Reports consuming regular diet without issues and denies N/V. Patient reports 0 bowel movement + passing flatus.  Denies issues with urination and reports bleeding is "lighter."  Patient is breastfeeding and reports going well.  Desires undecided for postpartum contraception.  Pain is being appropriately managed with use of  po meds. Denies SOB, CP, dizziness, heart palpitations, HA, RUQ pain or vision changes. .   Physical exam  Vitals:   07/16/22 1430 07/16/22 2052 07/17/22 0528 07/17/22 0633  BP: 138/88 133/78 (!) 127/97 (!) 135/96  Pulse: 99 93 85 76  Resp: 16 18 18    Temp: 98.6 F (37 C) 98.3 F (36.8 C) 97.9 F (36.6 C)   TempSrc: Oral Oral Oral   SpO2: 100% 100% 99%   Weight:      Height:       General: alert, cooperative, and no distress Lochia: appropriate Uterine Fundus: firm Perineum: intact DVT Evaluation: No evidence of DVT seen on physical exam. Negative Homan's sign. No cords or calf tenderness. No significant calf/ankle edema.  Labs: Lab Results  Component Value Date   WBC 11.2 (H) 07/16/2022   HGB 9.9 (L) 07/16/2022   HCT 28.7 (L) 07/16/2022   MCV 89.4 07/16/2022   PLT 93 (L) 07/16/2022      Latest Ref Rng & Units 07/17/2022    4:18 AM  CMP  Glucose 70 - 99 mg/dL 88   BUN 6 - 20 mg/dL <5   Creatinine 1.61 - 1.00 mg/dL 0.96   Sodium 045 - 409 mmol/L 137   Potassium 3.5 - 5.1 mmol/L 3.5   Chloride 98 - 111 mmol/L 104   CO2 22 - 32 mmol/L 22   Calcium 8.9 - 10.3 mg/dL 9.0   Total Protein 6.5 - 8.1 g/dL 5.6   Total Bilirubin 0.3 - 1.2 mg/dL 0.5   Alkaline Phos 38 - 126 U/L 90   AST 15 - 41 U/L 36   ALT 0 - 44 U/L 33     Date of discharge: 07/17/2022 Discharge Diagnoses: Term Pregnancy-delivered and GHTN/Hypokal/Hypomag Discharge instruction: per After Visit Summary and "Baby and Me Booklet".  After visit meds:   Activity:           unrestricted and pelvic rest Advance as tolerated. Pelvic rest for 6 weeks.  Diet:                routine Medications: PNV, Ibuprofen, Colace, Iron, and Procardia XL Postpartum contraception: Undecided Condition:  Pt discharge to home with baby in stable condition Anemia: PO iron Hypokal/Mag: eat food high in potassium and mag.  GHTN: procardia 30, 1 week BP check with [facility].   Meds: Allergies as of 07/17/2022   No Known  Allergies      Medication List     STOP taking these medications    ferrous sulfate 325 (65 FE) MG tablet       TAKE these medications    cyclobenzaprine 10 MG tablet Commonly known as: FLEXERIL Take 1 tablet (10 mg total) by mouth 2 (two) times daily as needed for muscle spasms.   Excedrin Tension Headache 500-65  MG Tabs per tablet Generic drug: acetaminophen-caffeine Take 2 tablets by mouth every 6 (six) hours as needed.   ibuprofen 600 MG tablet Commonly known as: ADVIL Take 1 tablet (600 mg total) by mouth every 6 (six) hours.   iron polysaccharides 150 MG capsule Commonly known as: NIFEREX Take 1 capsule (150 mg total) by mouth daily.   magnesium oxide 400 (240 Mg) MG tablet Commonly known as: MAG-OX Take 1 tablet (400 mg total) by mouth daily.   NIFEdipine 60 MG 24 hr tablet Commonly known as: ADALAT CC Take 1 tablet (60 mg total) by mouth daily.   PRENATAL PO Take 1 tablet by mouth daily.   VITAMIN D-3 PO Take 1 capsule by mouth daily.        Discharge Follow Up:   Follow-up Information     [facility]. Schedule an appointment as soon as possible for a visit in 1 week(s).   Specialty: Obstetrics and Gynecology Why: 1 week BP check and 6 week PPV Contact information: [address] [place] [address] [phone]                 [facility] CNM, FNP-C, PMHNP-BC  [id number]  [place], [place] [address]  Cell: [phone]  Office Phone: [phone] Fax: [phone] 07/17/2022  8:08 AM

## 2022-07-17 LAB — COMPREHENSIVE METABOLIC PANEL
ALT: 33 U/L (ref 0–44)
AST: 36 U/L (ref 15–41)
Albumin: 2.6 g/dL — ABNORMAL LOW (ref 3.5–5.0)
Alkaline Phosphatase: 90 U/L (ref 38–126)
Anion gap: 11 (ref 5–15)
BUN: <5 mg/dL — ABNORMAL LOW (ref 6–20)
CO2: 22 mmol/L (ref 22–32)
Calcium: 9 mg/dL (ref 8.9–10.3)
Chloride: 104 mmol/L (ref 98–111)
Creatinine, Ser: 0.63 mg/dL (ref 0.44–1.00)
GFR, Estimated: >60 mL/min (ref >60)
Glucose, Bld: 88 mg/dL (ref 70–99)
Potassium: 3.5 mmol/L (ref 3.5–5.1)
Sodium: 137 mmol/L (ref 135–145)
Total Bilirubin: 0.5 mg/dL (ref 0.3–1.2)
Total Protein: 5.6 g/dL — ABNORMAL LOW (ref 6.5–8.1)

## 2022-07-17 MED ORDER — POLYSACCHARIDE IRON COMPLEX 150 MG PO CAPS: 150.0000 mg | ORAL_CAPSULE | Freq: Every day | ORAL | 1 refills | Status: AC

## 2022-07-17 MED ORDER — NIFEDIPINE ER OSMOTIC RELEASE 30 MG PO TB24
60.0000 mg | ORAL_TABLET | Freq: Every day | ORAL | Status: DC
  Administered 2022-07-17: 60 mg via ORAL
  Filled 2022-07-17: qty 2

## 2022-07-17 MED ORDER — IBUPROFEN 600 MG PO TABS: 600.0000 mg | ORAL_TABLET | Freq: Four times a day (QID) | ORAL | 0 refills | Status: AC

## 2022-07-17 MED ORDER — NIFEDIPINE ER 60 MG PO TB24: 60.0000 mg | ORAL_TABLET | Freq: Every day | ORAL | 0 refills | Status: AC

## 2022-07-17 MED ORDER — MAGNESIUM OXIDE -MG SUPPLEMENT 400 (240 MG) MG PO TABS: 400.0000 mg | ORAL_TABLET | Freq: Every day | ORAL | 0 refills | Status: AC

## 2022-07-17 NOTE — Lactation Note (Signed)
This note was copied from a baby's chart. Lactation Consultation Note  Patient Name: Natalie Ortega [id number] Date: 07/17/2022 Age:23 hours Reason for consult: Mother's request;Primapara;Term Mom called for latch assistance d/t baby not latching. Baby fighting mom, refusing breast and crying. Baby was on the tip of the nipple when LC came into room. LC repositioned body alignment and bringing baby up closer to mom. Cheeks to breast. Baby wide awake looking around. Asked mom if baby rest well after feeding and mom stated yes. Cluster feeding should be starting as well as increase in output. Encouraged to occasionally compress breast during feeding to increase baby's intake during feeding. Call for assistance if needed.  Maternal Data Does the patient have breastfeeding experience prior to this delivery?: No  Feeding    LATCH Score Latch: Grasps breast easily, tongue down, lips flanged, rhythmical sucking.  Audible Swallowing: A few with stimulation  Type of Nipple: Everted at rest and after stimulation  Comfort (Breast/Nipple): Soft / non-tender  Hold (Positioning): Assistance needed to correctly position infant at breast and maintain latch.  LATCH Score: 8   Lactation Tools Discussed/Used    Interventions Interventions: Adjust position;Breast feeding basics reviewed;Support pillows;Assisted with latch;Skin to skin;Position options;Breast compression  Discharge    Consult Status Consult Status: Follow-up Date: 07/17/22 (in pm) Follow-up type: In-patient    Charyl Dancer 07/17/2022, 5:14 AM

## 2022-07-17 NOTE — Progress Notes (Signed)
Pt's BP 127/97 @ 0530, up feeding baby who had been fussy. BP rechecked @ 0633 while pt resting/asleep, 135/96. [facility] CNM called. Order to change procardia dose to 60mg  and to give dose now.

## 2022-07-26 ENCOUNTER — Telehealth (HOSPITAL_COMMUNITY): Payer: Self-pay

## 2022-07-26 NOTE — Telephone Encounter (Signed)
No answer. No voicemail option available.  [name] Larry [facility]   07/26/22,1932
